# Patient Record
Sex: Male | Born: 1990 | Race: White | Hispanic: No | Marital: Single | Smoking: Never smoker
Health system: Southern US, Community
[De-identification: ages and names within clinical notes are randomized; demographics above are authoritative.]

## PROBLEM LIST (undated history)

## (undated) ENCOUNTER — Emergency Department (HOSPITAL_COMMUNITY): Discharge status: Absent without leave | Payer: Self-pay

## (undated) HISTORY — PX: TONSILLECTOMY: SUR1361

## (undated) HISTORY — PX: TYMPANOSTOMY TUBE PLACEMENT: SHX32

---
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
## 2016-05-24 ENCOUNTER — Emergency Department (HOSPITAL_COMMUNITY)
Admission: EM | Admit: 2016-05-24 | Discharge: 2016-05-24 | Disposition: A | Discharge status: Home / Self Care | Payer: BLUE CROSS/BLUE SHIELD | Attending: Emergency Medicine | Admitting: Emergency Medicine

## 2016-05-24 ENCOUNTER — Emergency Department (HOSPITAL_COMMUNITY): Payer: BLUE CROSS/BLUE SHIELD

## 2016-05-24 ENCOUNTER — Encounter (HOSPITAL_COMMUNITY): Payer: Self-pay

## 2016-05-24 DIAGNOSIS — R10813 Right lower quadrant abdominal tenderness: Secondary | ICD-10-CM | POA: Insufficient documentation

## 2016-05-24 DIAGNOSIS — B349 Viral infection, unspecified: Secondary | ICD-10-CM | POA: Insufficient documentation

## 2016-05-24 DIAGNOSIS — R509 Fever, unspecified: Secondary | ICD-10-CM | POA: Diagnosis present

## 2016-05-24 LAB — URINE MICROSCOPIC-ADD ON

## 2016-05-24 LAB — URINALYSIS, ROUTINE W REFLEX MICROSCOPIC
Bilirubin Urine: NEGATIVE
GLUCOSE, UA: NEGATIVE mg/dL
Ketones, ur: 40 mg/dL — AB
Leukocytes, UA: NEGATIVE
Nitrite: NEGATIVE
Protein, ur: NEGATIVE mg/dL
SPECIFIC GRAVITY, URINE: 1.022 (ref 1.005–1.030)
pH: 6 (ref 5.0–8.0)

## 2016-05-24 LAB — CBC
HEMATOCRIT: 43.4 % (ref 39.0–52.0)
HEMOGLOBIN: 15 g/dL (ref 13.0–17.0)
MCH: 30.7 pg (ref 26.0–34.0)
MCHC: 34.6 g/dL (ref 30.0–36.0)
MCV: 88.9 fL (ref 78.0–100.0)
Platelets: 278 10*3/uL (ref 150–400)
RBC: 4.88 MIL/uL (ref 4.22–5.81)
RDW: 12.4 % (ref 11.5–15.5)
WBC: 14.6 10*3/uL — ABNORMAL HIGH (ref 4.0–10.5)

## 2016-05-24 LAB — COMPREHENSIVE METABOLIC PANEL
ALT: 21 U/L (ref 17–63)
ANION GAP: 9 (ref 5–15)
AST: 19 U/L (ref 15–41)
Albumin: 4.8 g/dL (ref 3.5–5.0)
Alkaline Phosphatase: 50 U/L (ref 38–126)
BUN: 12 mg/dL (ref 6–20)
CHLORIDE: 102 mmol/L (ref 101–111)
CO2: 24 mmol/L (ref 22–32)
Calcium: 9.4 mg/dL (ref 8.9–10.3)
Creatinine, Ser: 0.96 mg/dL (ref 0.61–1.24)
GFR calc non Af Amer: >60 mL/min (ref >60)
Glucose, Bld: 112 mg/dL — ABNORMAL HIGH (ref 65–99)
Potassium: 3.7 mmol/L (ref 3.5–5.1)
SODIUM: 135 mmol/L (ref 135–145)
Total Bilirubin: 1 mg/dL (ref 0.3–1.2)
Total Protein: 8.1 g/dL (ref 6.5–8.1)

## 2016-05-24 LAB — LIPASE, BLOOD: LIPASE: 22 U/L (ref 11–51)

## 2016-05-24 MED ORDER — IOPAMIDOL (ISOVUE-300) INJECTION 61%
100.0000 mL | Freq: Once | INTRAVENOUS | Status: AC | PRN
  Administered 2016-05-24: 100 mL via INTRAVENOUS

## 2016-05-24 MED ORDER — METOCLOPRAMIDE HCL 5 MG/ML IJ SOLN
5.0000 mg | Freq: Once | INTRAMUSCULAR | Status: AC
  Administered 2016-05-24: 5 mg via INTRAVENOUS
  Filled 2016-05-24: qty 2

## 2016-05-24 MED ORDER — MORPHINE SULFATE (PF) 2 MG/ML IV SOLN
INTRAVENOUS | Status: AC
  Filled 2016-05-24: qty 1

## 2016-05-24 MED ORDER — ACETAMINOPHEN 325 MG PO TABS
650.0000 mg | ORAL_TABLET | Freq: Once | ORAL | Status: AC
  Administered 2016-05-24: 650 mg via ORAL
  Filled 2016-05-24: qty 2

## 2016-05-24 MED ORDER — DIPHENHYDRAMINE HCL 50 MG/ML IJ SOLN
12.5000 mg | Freq: Once | INTRAMUSCULAR | Status: AC
Start: 2016-05-24 — End: 2016-05-24
  Administered 2016-05-24: 12.5 mg via INTRAVENOUS
  Filled 2016-05-24: qty 1

## 2016-05-24 MED ORDER — ONDANSETRON 8 MG PO TBDP: 8.0000 mg | ORAL_TABLET | Freq: Three times a day (TID) | ORAL | 0 refills | Status: DC | PRN

## 2016-05-24 MED ORDER — IOPAMIDOL (ISOVUE-300) INJECTION 61%
15.0000 mL | Freq: Two times a day (BID) | INTRAVENOUS | Status: DC | PRN
  Administered 2016-05-24: 15 mL via ORAL
  Filled 2016-05-24: qty 30

## 2016-05-24 MED ORDER — HYDROCODONE-ACETAMINOPHEN 5-325 MG PO TABS: 2.0000 | ORAL_TABLET | ORAL | 0 refills | Status: DC | PRN

## 2016-05-24 MED ORDER — ONDANSETRON HCL 4 MG/2ML IJ SOLN
4.0000 mg | Freq: Once | INTRAMUSCULAR | Status: AC
  Administered 2016-05-24: 4 mg via INTRAVENOUS
  Filled 2016-05-24: qty 2

## 2016-05-24 MED ORDER — SODIUM CHLORIDE 0.9 % IV BOLUS (SEPSIS)
1000.0000 mL | Freq: Once | INTRAVENOUS | Status: AC
  Administered 2016-05-24: 1000 mL via INTRAVENOUS

## 2016-05-24 MED ORDER — IOPAMIDOL (ISOVUE-300) INJECTION 61%
INTRAVENOUS | Status: AC
  Administered 2016-05-24: 15 mL
  Filled 2016-05-24: qty 30

## 2016-05-24 MED ORDER — MORPHINE SULFATE (PF) 2 MG/ML IV SOLN
2.0000 mg | Freq: Once | INTRAVENOUS | Status: AC
  Administered 2016-05-24: 2 mg via INTRAVENOUS
  Filled 2016-05-24: qty 1

## 2016-05-24 MED ORDER — KETOROLAC TROMETHAMINE 30 MG/ML IJ SOLN
30.0000 mg | Freq: Once | INTRAMUSCULAR | Status: AC
  Administered 2016-05-24: 30 mg via INTRAVENOUS
  Filled 2016-05-24: qty 1

## 2016-05-24 MED ORDER — METHOCARBAMOL 500 MG PO TABS: 500.0000 mg | ORAL_TABLET | Freq: Two times a day (BID) | ORAL | 0 refills | Status: DC

## 2016-05-24 MED ORDER — IOPAMIDOL (ISOVUE-300) INJECTION 61%
INTRAVENOUS | Status: AC
  Filled 2016-05-24: qty 100

## 2016-05-24 MED ORDER — SODIUM CHLORIDE 0.9 % IV SOLN: INTRAVENOUS | Status: DC

## 2016-05-24 NOTE — ED Notes (Signed)
Pt refuses IV or blood draw at this time. Explained risks/benefits. Pt still refuses.

## 2016-05-24 NOTE — ED Provider Notes (Signed)
[facility]-EMERGENCY DEPT Provider Note   CSN: [id number] Arrival date & time: 05/24/16  [id number]     History   Chief Complaint Chief Complaint  Patient presents with  . Fever  . Back Pain    HPI Christian Lucas is a 25 y.o. male.  25 year old male presents with 3 days of bilateral flank pain as well as myalgias and fever. Has had no cough or congestion. Has had anorexia. Has been using over-the-counter medications without relief. Denies any sore throat or cough congestion. No dysuria or hematuria. No possible urine. Went to an outside facility was found to have a temperature of 102.9. His heart rate is in the 140s. Patient examined there and concern for pyelonephritis versus appendicitis due to his lower abdominal discomfort on exam was obtained and patient sent here for further evaluation. Patient had a negative flu and strep at the outside facility. Patient states Christian Lucas is extremely ages at this time because Christian Lucas hates needles.      History reviewed. No pertinent past medical history.  There are no active problems to display for this patient.   Past Surgical History:  Procedure Laterality Date  . TONSILLECTOMY    . TYMPANOSTOMY TUBE PLACEMENT         Home Medications    Prior to Admission medications   Not on File    Family History History reviewed. No pertinent family history.  Social History Social History  Substance Use Topics  . Smoking status: Never Smoker  . Smokeless tobacco: Never Used  . Alcohol use Yes     Comment: social     Allergies   Patient has no known allergies.   Review of Systems Review of Systems  All other systems reviewed and are negative.    Physical Exam Updated Vital Signs BP 138/85 (BP Location: Left Arm)   Pulse (!) 146   Temp 99.6 F (37.6 C) (Oral) Comment: pt had water on the way  Resp 18   SpO2 98%   Physical Exam  Constitutional: Christian Lucas is oriented to person, place, and time. Christian Lucas appears well-developed and  well-nourished.  Non-toxic appearance. No distress.  HENT:  Head: Normocephalic and atraumatic.  Eyes: Conjunctivae, EOM and lids are normal. Pupils are equal, round, and reactive to light.  Neck: Normal range of motion. Neck supple. No tracheal deviation present. No thyroid mass present.  Cardiovascular: Regular rhythm and normal heart sounds.  Tachycardia present.  Exam reveals no gallop.   No murmur heard. Pulmonary/Chest: Effort normal and breath sounds normal. No stridor. No respiratory distress. Christian Lucas has no decreased breath sounds. Christian Lucas has no wheezes. Christian Lucas has no rhonchi. Christian Lucas has no rales.  Abdominal: Soft. Normal appearance and bowel sounds are normal. Christian Lucas exhibits no distension. There is tenderness in the right lower quadrant. There is no rebound and no CVA tenderness.    Musculoskeletal: Normal range of motion. Christian Lucas exhibits no edema or tenderness.  Neurological: Christian Lucas is alert and oriented to person, place, and time. Christian Lucas has normal strength. No cranial nerve deficit or sensory deficit. GCS eye subscore is 4. GCS verbal subscore is 5. GCS motor subscore is 6.  Skin: Skin is warm and dry. No abrasion and no rash noted.  Psychiatric: His speech is normal and behavior is normal. His mood appears anxious.  Nursing note and vitals reviewed.    ED Treatments / Results  Labs (all labs ordered are listed, but only abnormal results are displayed) Labs Reviewed  LIPASE, BLOOD  COMPREHENSIVE METABOLIC  PANEL  CBC  URINALYSIS, ROUTINE W REFLEX MICROSCOPIC (NOT AT [facility])    EKG  EKG Interpretation None       Radiology No results found.  Procedures Procedures (including critical care time)  Medications Ordered in ED Medications  0.9 %  sodium chloride infusion (not administered)  sodium chloride 0.9 % bolus 1,000 mL (not administered)  ondansetron (ZOFRAN) injection 4 mg (not administered)  morphine 2 MG/ML injection 2 mg (not administered)     Initial Impression / Assessment and Plan  / ED Course  I have reviewed the triage vital signs and the nursing notes.  Pertinent labs & imaging results that were available during my care of the patient were reviewed by me and considered in my medical decision making (see chart for details).  Clinical Course     Patient given IV hydration here for his dehydration. Abdominal CT without acute findings. Christian Lucas has no infectious signs of his urine. Suspect Christian Lucas has a viral etiology for his current symptoms. Return precautions given  Final Clinical Impressions(s) / ED Diagnoses   Final diagnoses:  None    New Prescriptions New Prescriptions   No medications on file     Lorre NickAnthony Marcelle Bebout, MD 05/24/16 1306

## 2016-05-24 NOTE — ED Notes (Signed)
ED Provider at bedside. 

## 2016-05-24 NOTE — ED Notes (Signed)
Patient given urnal and will call out when sample is ready

## 2016-05-24 NOTE — ED Notes (Signed)
Pt given discharge instructions and expressed understanding.  No reaction to medication noted at time of discharge.  Pt escorted by parents out of department.

## 2016-05-24 NOTE — ED Notes (Signed)
[facility] facility called and reports Pt had fever 102.  No medications were given.  Also, strep and flu swabs were negative.

## 2016-05-24 NOTE — ED Notes (Signed)
Urinal bedside  

## 2016-05-24 NOTE — ED Triage Notes (Signed)
Pt hasn't felt well for 3 days. Fever x 3 days.  Bilateral lower back pain.  Body aches with nausea.  Poor appetite.  Pt went today to [facility].  Pt told to come here.  Strep and flu test were done.  However pt also had fever of 102.9.  Not medicated at facility.  HR elevated in assessment 140's

## 2016-05-24 NOTE — ED Notes (Signed)
Pt transported to CT ?

## 2017-01-12 ENCOUNTER — Ambulatory Visit: Payer: BLUE CROSS/BLUE SHIELD | Admitting: Family Medicine

## 2017-01-19 ENCOUNTER — Ambulatory Visit (INDEPENDENT_AMBULATORY_CARE_PROVIDER_SITE_OTHER): Payer: BLUE CROSS/BLUE SHIELD | Admitting: Family Medicine

## 2017-01-19 ENCOUNTER — Encounter: Payer: Self-pay | Admitting: Family Medicine

## 2017-01-19 VITALS — BP 122/68 | HR 77 | Temp 98.0°F | Ht 65.0 in | Wt 180.6 lb

## 2017-01-19 DIAGNOSIS — Z0000 Encounter for general adult medical examination without abnormal findings: Secondary | ICD-10-CM

## 2017-01-19 LAB — BASIC METABOLIC PANEL
BUN: 13 mg/dL (ref 6–23)
CO2: 32 mEq/L (ref 19–32)
CREATININE: 0.86 mg/dL (ref 0.40–1.50)
Calcium: 10 mg/dL (ref 8.4–10.5)
Chloride: 101 mEq/L (ref 96–112)
GFR: 116.26 mL/min (ref >60.00)
GLUCOSE: 107 mg/dL — AB (ref 70–99)
Potassium: 3.9 mEq/L (ref 3.5–5.1)
Sodium: 139 mEq/L (ref 135–145)

## 2017-01-19 LAB — CBC WITH DIFFERENTIAL/PLATELET
BASOS PCT: 0.4 % (ref 0.0–3.0)
Basophils Absolute: 0 10*3/uL (ref 0.0–0.1)
EOS ABS: 0.1 10*3/uL (ref 0.0–0.7)
EOS PCT: 1.3 % (ref 0.0–5.0)
HEMATOCRIT: 46.8 % (ref 39.0–52.0)
HEMOGLOBIN: 15.9 g/dL (ref 13.0–17.0)
LYMPHS PCT: 37.9 % (ref 12.0–46.0)
Lymphs Abs: 2.2 10*3/uL (ref 0.7–4.0)
MCHC: 34 g/dL (ref 30.0–36.0)
MCV: 88.8 fl (ref 78.0–100.0)
Monocytes Absolute: 0.6 10*3/uL (ref 0.1–1.0)
Monocytes Relative: 10.1 % (ref 3.0–12.0)
Neutro Abs: 2.9 10*3/uL (ref 1.4–7.7)
Neutrophils Relative %: 50.3 % (ref 43.0–77.0)
Platelets: 315 10*3/uL (ref 150.0–400.0)
RBC: 5.27 Mil/uL (ref 4.22–5.81)
RDW: 12.4 % (ref 11.5–15.5)
WBC: 5.7 10*3/uL (ref 4.0–10.5)

## 2017-01-19 LAB — HEPATIC FUNCTION PANEL
ALK PHOS: 43 U/L (ref 39–117)
ALT: 21 U/L (ref 0–53)
AST: 16 U/L (ref 0–37)
Albumin: 4.6 g/dL (ref 3.5–5.2)
BILIRUBIN TOTAL: 0.7 mg/dL (ref 0.2–1.2)
Bilirubin, Direct: 0.1 mg/dL (ref 0.0–0.3)
Total Protein: 7.2 g/dL (ref 6.0–8.3)

## 2017-01-19 LAB — LIPID PANEL
CHOLESTEROL: 201 mg/dL — AB (ref 0–200)
HDL: 43.5 mg/dL (ref >39.00)
LDL Cholesterol: 130 mg/dL — ABNORMAL HIGH (ref 0–99)
NONHDL: 157.33
Total CHOL/HDL Ratio: 5
Triglycerides: 137 mg/dL (ref 0.0–149.0)
VLDL: 27.4 mg/dL (ref 0.0–40.0)

## 2017-01-19 LAB — TSH: TSH: 1.16 u[IU]/mL (ref 0.35–4.50)

## 2017-01-19 MED ORDER — SUMATRIPTAN SUCCINATE 100 MG PO TABS
100.0000 mg | ORAL_TABLET | ORAL | 5 refills | Status: DC | PRN
Start: 2017-01-19 — End: 2017-08-03

## 2017-01-19 MED ORDER — MELOXICAM 15 MG PO TABS: 15.0000 mg | ORAL_TABLET | Freq: Every day | ORAL | 3 refills | Status: DC

## 2017-01-19 NOTE — Patient Instructions (Addendum)
WE NOW OFFER   [facility] FAST TRACK!!!  SAME DAY Appointments for ACUTE CARE  Such as: Sprains, Injuries, cuts, abrasions, rashes, muscle pain, joint pain, back pain Colds, flu, sore throats, headache, allergies, cough, fever  Ear pain, sinus and eye infections Abdominal pain, nausea, vomiting, diarrhea, upset stomach Animal/insect bites  3 Easy Ways to Schedule: Walk-In Scheduling Call in scheduling Mychart Sign-up: [email or web address]         Migraine Headache A migraine headache is an intense, throbbing pain on one side or both sides of the head. Migraines may also cause other symptoms, such as nausea, vomiting, and sensitivity to light and noise. What are the causes? Doing or taking certain things may also trigger migraines, such as:  Alcohol.  Smoking.  Medicines, such as:  Medicine used to treat chest pain (nitroglycerine).  Birth control pills.  Estrogen pills.  Certain blood pressure medicines.  Aged cheeses, chocolate, or caffeine.  Foods or drinks that contain nitrates, glutamate, aspartame, or tyramine.  Physical activity. Other things that may trigger a migraine include:  Menstruation.  Pregnancy.  Hunger.  Stress, lack of sleep, too much sleep, or fatigue.  Weather changes. What increases the risk? The following factors may make you more likely to experience migraine headaches:  Age. Risk increases with age.  Family history of migraine headaches.  Being Caucasian.  Depression and anxiety.  Obesity.  Being a woman.  Having a hole in the heart (patent foramen ovale) or other heart problems. What are the signs or symptoms? The main symptom of this condition is pulsating or throbbing pain. Pain may:  Happen in any area of the head, such as on one side or both sides.  Interfere with daily activities.  Get worse with physical activity.  Get worse with exposure to bright lights or loud noises. Other  symptoms may include:  Nausea.  Vomiting.  Dizziness.  General sensitivity to bright lights, loud noises, or smells. Before you get a migraine, you may get warning signs that a migraine is developing (aura). An aura may include:  Seeing flashing lights or having blind spots.  Seeing bright spots, halos, or zigzag lines.  Having tunnel vision or blurred vision.  Having numbness or a tingling feeling.  Having trouble talking.  Having muscle weakness. How is this diagnosed? A migraine headache can be diagnosed based on:  Your symptoms.  A physical exam.  Tests, such as CT scan or MRI of the head. These imaging tests can help rule out other causes of headaches.  Taking fluid from the spine (lumbar puncture) and analyzing it (cerebrospinal fluid analysis, or CSF analysis). How is this treated? A migraine headache is usually treated with medicines that:  Relieve pain.  Relieve nausea.  Prevent migraines from coming back. Treatment may also include:  Acupuncture.  Lifestyle changes like avoiding foods that trigger migraines. Follow these instructions at home: Medicines   Take over-the-counter and prescription medicines only as told by your health care provider.  Do not drive or use heavy machinery while taking prescription pain medicine.  To prevent or treat constipation while you are taking prescription pain medicine, your health care provider may recommend that you:  Drink enough fluid to keep your urine clear or pale yellow.  Take over-the-counter or prescription medicines.  Eat foods that are high in fiber, such as fresh fruits and vegetables, whole grains, and beans.  Limit foods that are high in fat and processed sugars, such as fried and sweet foods. Lifestyle     Avoid alcohol use.  Do not use any products that contain nicotine or tobacco, such as cigarettes and e-cigarettes. If you need help quitting, ask your health care provider.  Get at least 8  hours of sleep every night.  Limit your stress. General instructions    Keep a journal to find out what may trigger your migraine headaches. For example, write down:  What you eat and drink.  How much sleep you get.  Any change to your diet or medicines.  If you have a migraine:  Avoid things that make your symptoms worse, such as bright lights.  It may help to lie down in a dark, quiet room.  Do not drive or use heavy machinery.  Ask your health care provider what activities are safe for you while you are experiencing symptoms.  Keep all follow-up visits as told by your health care provider. This is important. Contact a health care provider if:  You develop symptoms that are different or more severe than your usual migraine symptoms. Get help right away if:  Your migraine becomes severe.  You have a fever.  You have a stiff neck.  You have vision loss.  Your muscles feel weak or like you cannot control them.  You start to lose your balance often.  You develop trouble walking.  You faint. This information is not intended to replace advice given to you by your health care provider. Make sure you discuss any questions you have with your health care provider. Document Released: 06/21/2005 Document Revised: 01/09/2016 Document Reviewed: 12/08/2015 Elsevier Interactive Patient Education  2017 Elsevier Inc.  

## 2017-01-19 NOTE — Progress Notes (Signed)
Subjective:     Patient ID: Christian Lucas, male   DOB: 01/20/1991, 26 y.o.   MRN: [id number]  HPI Patient seen to establish care and for well visit. I had seen him over 10 years ago previous practice. 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 He takes no regular medications. He apparently gained weight at over 200 pounds and had some borderline elevated blood pressures and has made some major lifestyle changes in the past year. He is exercising with running 3 days per week and making better dietary sources and has lost over 20 pounds and blood pressures have been normal.  History of migraine headaches. He states he sometimes has up to 3 or 4 per week. Uses over-the-counter Motrin without much success. He only gets about 5 hours sleep per night. Not aware of any food triggers. Possibly related to stress. No caffeine use.  Chronic back difficulties cervical to lumbar. He states he has been diagnosed in the past with some mild scoliosis. Has some almost daily pain  And recent tetanus booster. Other immunizations up-to-date.  Family history significant for father with coronary disease in his 7340s. Patient would like some baseline labs especially with lipids. He is nonsmoker. No regular alcohol use.  No past medical history on file. Past Surgical History:  Procedure Laterality Date  . TONSILLECTOMY    . TYMPANOSTOMY TUBE PLACEMENT      reports that he has never smoked. He has never used smokeless tobacco. He reports that he drinks alcohol. He reports that he does not use drugs. family history is not on file. No Known Allergies   Review of Systems  Constitutional: Negative for activity change, appetite change, fatigue and fever.  HENT: Negative for congestion, ear pain and trouble swallowing.   Eyes: Negative for pain and visual disturbance.  Respiratory: Negative for cough, shortness of breath and wheezing.   Cardiovascular: Negative for chest pain and  palpitations.  Gastrointestinal: Negative for abdominal distention, abdominal pain, blood in stool, constipation, diarrhea, nausea, rectal pain and vomiting.  Endocrine: Negative for polydipsia and polyuria.  Genitourinary: Negative for dysuria, hematuria and testicular pain.  Musculoskeletal: Negative for arthralgias and joint swelling.  Skin: Negative for rash.  Neurological: Positive for headaches. Negative for dizziness and syncope.  Hematological: Negative for adenopathy.  Psychiatric/Behavioral: Negative for confusion and dysphoric mood.       Objective:   Physical Exam  Constitutional: He is oriented to person, place, and time. He appears well-developed and well-nourished. No distress.  HENT:  Head: Normocephalic and atraumatic.  Right Ear: External ear normal.  Left Ear: External ear normal.  Mouth/Throat: Oropharynx is clear and moist.  Eyes: Pupils are equal, round, and reactive to light. Conjunctivae and EOM are normal.  Neck: Normal range of motion. Neck supple. No thyromegaly present.  Cardiovascular: Normal rate, regular rhythm and normal heart sounds.   No murmur heard. Pulmonary/Chest: No respiratory distress. He has no wheezes. He has no rales.  Abdominal: Soft. Bowel sounds are normal. He exhibits no distension and no mass. There is no tenderness. There is no rebound and no guarding.  Musculoskeletal: He exhibits no edema.  Lymphadenopathy:    He has no cervical adenopathy.  Neurological: He is alert and oriented to person, place, and time. He displays normal reflexes. No cranial nerve deficit.  Skin: No rash noted.  Psychiatric: He has a normal mood and affect.       Assessment:     Physical exam. Patient  has family history of premature CAD as above. Frequent migraine headaches. History of borderline elevated blood pressure in the past but well controlled today.  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 -Did prescribe meloxicam 15 mg to take once daily as needed but try to avoid regular use  Kristian Covey MD [facility]

## 2017-01-21 ENCOUNTER — Ambulatory Visit (INDEPENDENT_AMBULATORY_CARE_PROVIDER_SITE_OTHER): Payer: BLUE CROSS/BLUE SHIELD | Admitting: *Deleted

## 2017-01-21 DIAGNOSIS — Z111 Encounter for screening for respiratory tuberculosis: Secondary | ICD-10-CM

## 2017-01-21 NOTE — Progress Notes (Signed)
Patient here for PPD skin test for college; administered ID to left forearm; patient tolerated well; patient to return in 48-72 hours for reading; understanding voiced.

## 2017-01-24 LAB — TB SKIN TEST
Induration: 0 mm
TB SKIN TEST: NEGATIVE

## 2017-01-24 NOTE — Progress Notes (Addendum)
PPD READ DATE 01/24/17 @ 9:44am  PPD Reading Note PPD read and results entered in EpicCare. Result: 0 mm induration. Interpretation: NEGATIVE If test not read within 48-72 hours of initial placement, patient advised to repeat in other arm 1-3 weeks after this test. Allergic reaction: no

## 2017-02-02 ENCOUNTER — Telehealth: Payer: Self-pay | Admitting: Family Medicine

## 2017-02-02 MED ORDER — HEPATITIS A VACCINE 25 UNIT/0.5ML IM SUSP: INTRAMUSCULAR | 0 refills | Status: DC

## 2017-02-02 NOTE — Telephone Encounter (Signed)
Okay to send Rx 

## 2017-02-02 NOTE — Telephone Encounter (Signed)
OK 

## 2017-02-02 NOTE — Telephone Encounter (Signed)
Rx sent 

## 2017-02-02 NOTE — Telephone Encounter (Signed)
Pt needs a rx for hep A fax to [facility] [phone]. Pt was last seen on 01/19/17

## 2017-02-07 ENCOUNTER — Telehealth: Payer: Self-pay | Admitting: Family Medicine

## 2017-02-07 MED ORDER — HEPATITIS A VACCINE 50 UNIT/ML IM SUSP: 50.0000 [IU] | Freq: Once | INTRAMUSCULAR | 0 refills | Status: AC

## 2017-02-07 NOTE — Telephone Encounter (Signed)
New rx sent

## 2017-02-07 NOTE — Telephone Encounter (Signed)
° ° ° °  Pharmacy call to say the 25ml is a pediatric dose and is asking if pt should be getting the 50 units per 1ml   [phone] Alisha

## 2017-05-31 IMAGING — CT CT ABD-PELV W/ CM
2 of 4 series · 16 of 46 positions shown, 18 images · IV contrast (ISOVUE)
Comparison: None.

CLINICAL DATA: Fever for 3 days.

EXAM:
CT ABDOMEN AND PELVIS WITH CONTRAST
TECHNIQUE: Multidetector CT imaging of the abdomen and pelvis was performed
using the standard protocol following bolus administration of
intravenous contrast.
CONTRAST:  100mL 6EQSP0-ISS IOPAMIDOL (6EQSP0-ISS) INJECTION 61%

[Series 2: abd/pel with · axial · 0.74mm/px · z∈[+1114,+1544]mm · 13 of 96 slices shown, 15 images]
[im 5/96  soft-tissue]
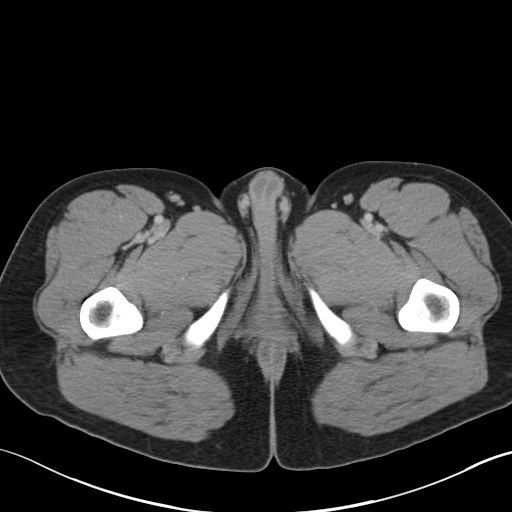
[im 5/96  bone]
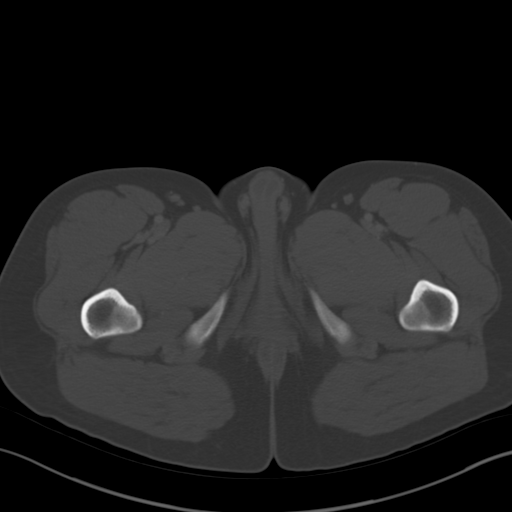
[im 14/96  soft-tissue]
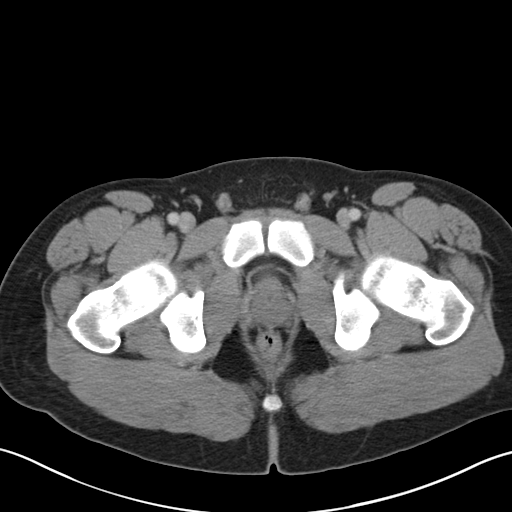
[im 19/96  soft-tissue]
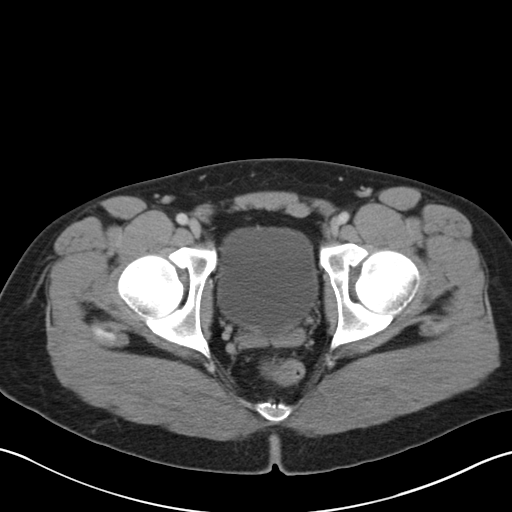
[im 28/96  soft-tissue]
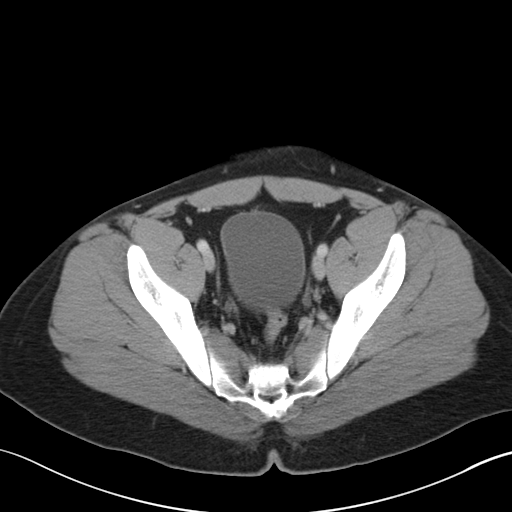
[im 32/96  soft-tissue]
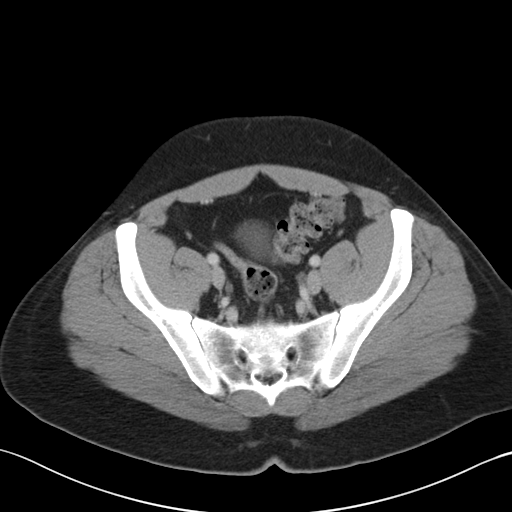
[im 41/96  soft-tissue]
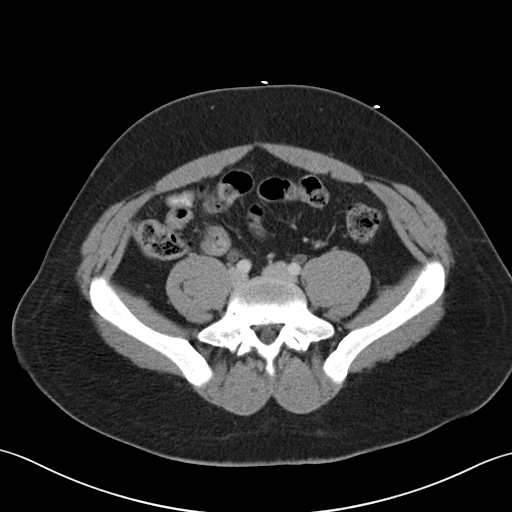
[im 50/96  soft-tissue]
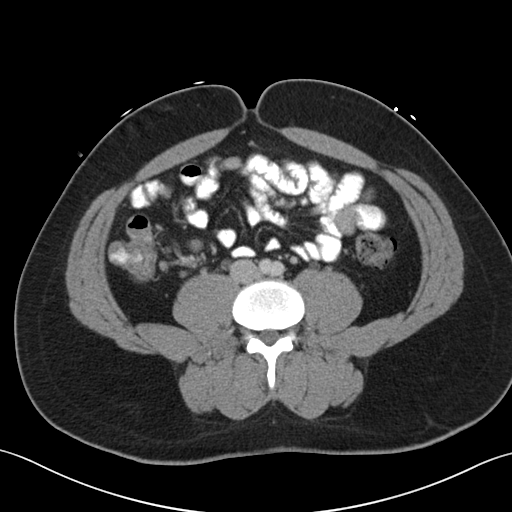
[im 55/96  soft-tissue]
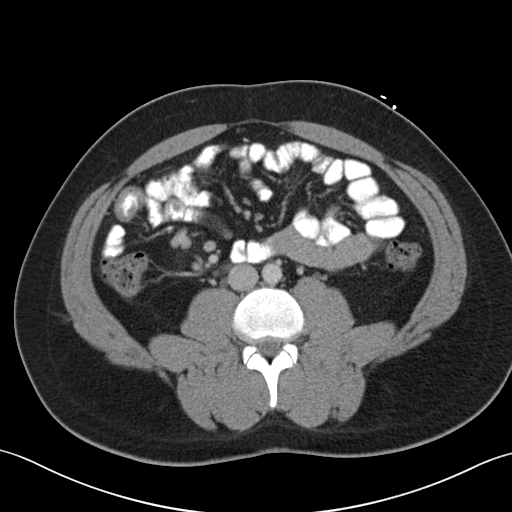
[im 64/96  soft-tissue]
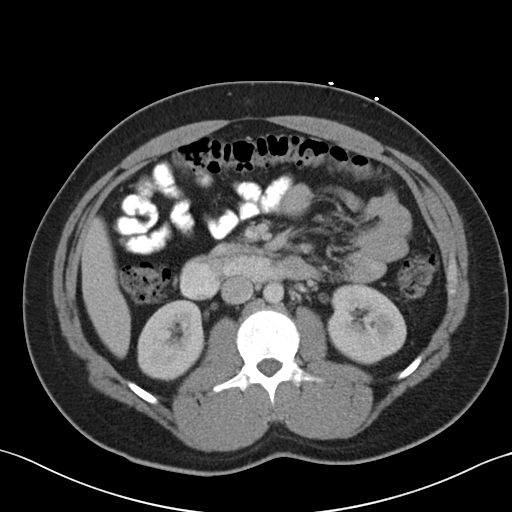
[im 64/96  bone]
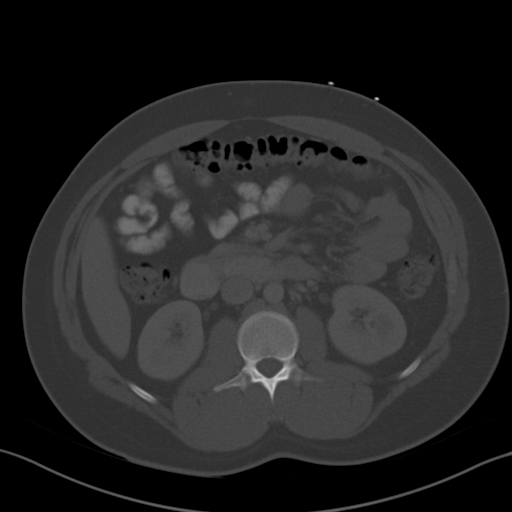
[im 68/96  soft-tissue]
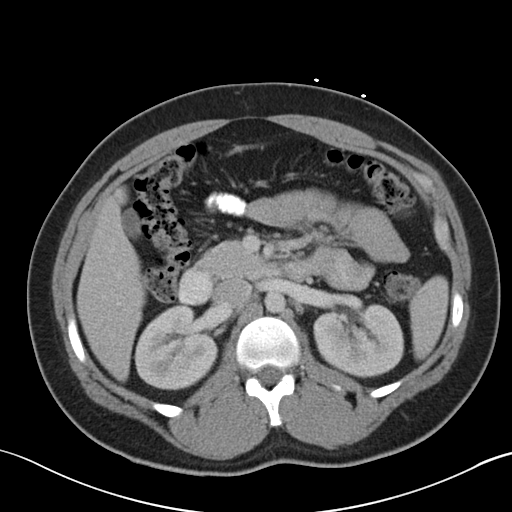
[im 77/96  soft-tissue]
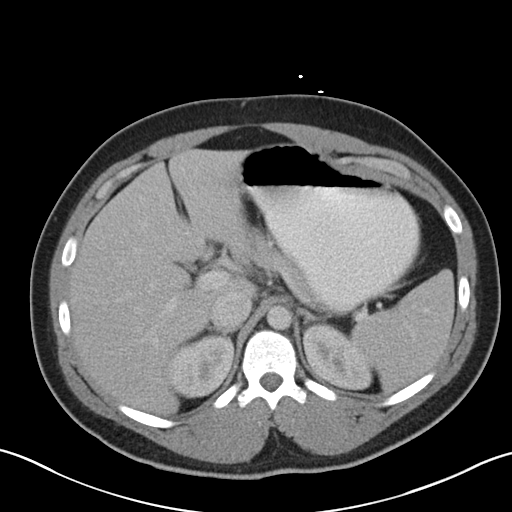
[im 82/96  soft-tissue]
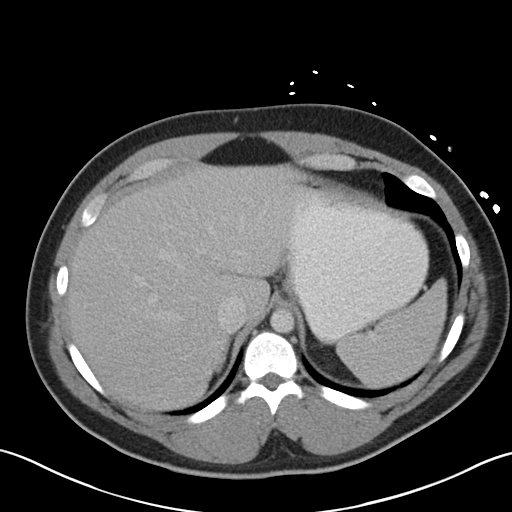
[im 91/96  soft-tissue]
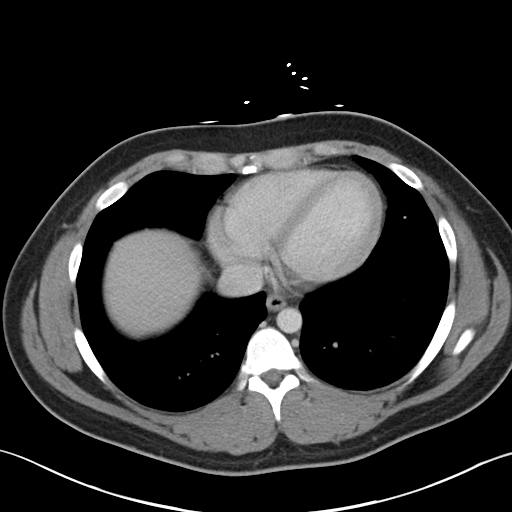

[Series 5: coronal a/|p · coronal · 0.74mm/px · 3 of 156 slices shown]
[im 52/156  soft-tissue]
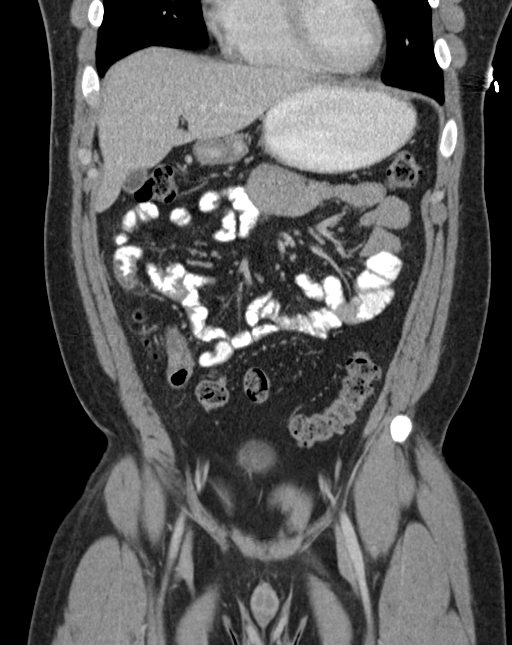
[im 69/156  soft-tissue]
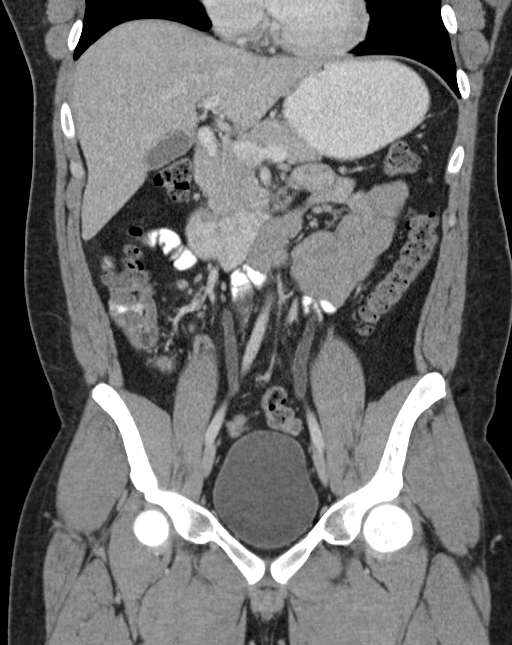
[im 87/156  soft-tissue]
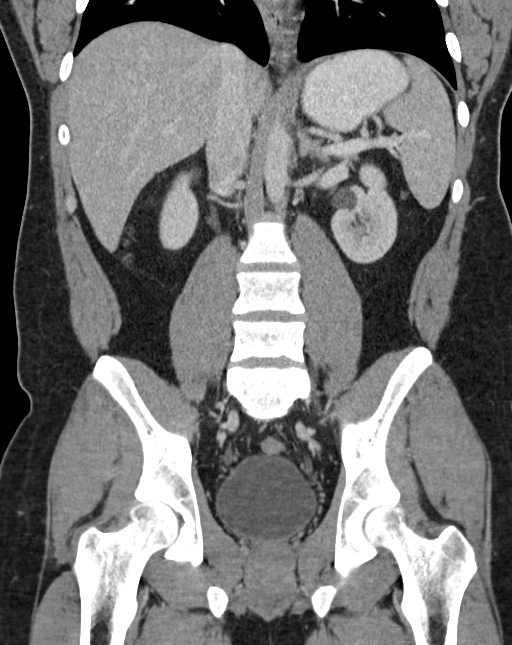

[16 of 46 positions shown; findings below may reference images not displayed]

FINDINGS: Lower chest: No acute abnormality.

Hepatobiliary: No focal liver abnormality is seen. No gallstones,
gallbladder wall thickening, or biliary dilatation.

Pancreas: Unremarkable. No pancreatic ductal dilatation or
surrounding inflammatory changes.

Spleen: Normal in size without focal abnormality.

Adrenals/Urinary Tract: Adrenal glands are unremarkable. Kidneys are
normal, without renal calculi, focal lesion, or hydronephrosis.
Bladder is unremarkable.

Stomach/Bowel: Stomach is within normal limits. Appendix appears
normal. No evidence of bowel wall thickening, distention, or
inflammatory changes.

Vascular/Lymphatic: Normal appearance of the abdominal aorta.
Prominent right lower quadrant ileocolic mesenteric lymph nodes
identified measuring up to 11 mm, image 46 of series 2.

Reproductive: Prostate is unremarkable.

Other: No abdominal wall hernia or abnormality. No abdominopelvic
ascites.

Musculoskeletal: No acute or significant osseous findings.
IMPRESSION: 1. No explanation for lower back pain and fevers. The appendix is
visualized and appears normal.
2. Prominent right lower quadrant mesenteric ileocolic lymph nodes
are identified.

## 2017-08-03 ENCOUNTER — Other Ambulatory Visit: Payer: Self-pay | Admitting: Family Medicine

## 2018-01-24 ENCOUNTER — Encounter: Payer: Self-pay | Admitting: Family Medicine

## 2018-01-24 ENCOUNTER — Ambulatory Visit (INDEPENDENT_AMBULATORY_CARE_PROVIDER_SITE_OTHER): Payer: BLUE CROSS/BLUE SHIELD | Admitting: Family Medicine

## 2018-01-24 VITALS — BP 110/80 | HR 128 | Temp 98.7°F | Ht 66.0 in | Wt 210.3 lb

## 2018-01-24 DIAGNOSIS — E785 Hyperlipidemia, unspecified: Secondary | ICD-10-CM

## 2018-01-24 DIAGNOSIS — G43909 Migraine, unspecified, not intractable, without status migrainosus: Secondary | ICD-10-CM | POA: Diagnosis not present

## 2018-01-24 DIAGNOSIS — R739 Hyperglycemia, unspecified: Secondary | ICD-10-CM | POA: Diagnosis not present

## 2018-01-24 DIAGNOSIS — Z0000 Encounter for general adult medical examination without abnormal findings: Secondary | ICD-10-CM

## 2018-01-24 DIAGNOSIS — E669 Obesity, unspecified: Secondary | ICD-10-CM | POA: Insufficient documentation

## 2018-01-24 MED ORDER — TOPIRAMATE 25 MG PO TABS: ORAL_TABLET | ORAL | 5 refills | Status: DC

## 2018-01-24 NOTE — Progress Notes (Signed)
Subjective:     Patient ID: Christian Lucas, male   DOB: 12/14/1990, 27 y.o.   MRN: [id number]  HPI Patient here for physical exam-and for additional evaluation of chronic problem as below with migraine headaches. He is currently attending graduate school [employer]. 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 Extremely busy and very stressful with regard to school. He states he is a "stress eater ". He's gained about 30 pounds since last year. Not exercising regularly. Glucose last year 107 fasting. No follow-up since then. Also had mildly elevated lipids.  He has history of migraine headaches. He feels combination of poor sleep quality and increased stress have triggered frequent headaches. Sometimes up to 3 per week. He usually runs out of Imitrex because of frequency. Has not been on prophylactic medications previously. He is not aware of any food triggers.  No past medical history on file. Past Surgical History:  Procedure Laterality Date  . TONSILLECTOMY    . TYMPANOSTOMY TUBE PLACEMENT      reports that he has never smoked. He has never used smokeless tobacco. He reports that he drinks alcohol. He reports that he does not use drugs. family history includes Cancer (age of onset: 2270) in his maternal grandfather; Heart disease (age of onset: 3045) in his father; Hyperlipidemia in his father; Hypertension in his father. No Known Allergies   Review of Systems  Constitutional: Negative for activity change, appetite change, fatigue and fever.  HENT: Negative for congestion, ear pain and trouble swallowing.   Eyes: Negative for pain and visual disturbance.  Respiratory: Negative for cough, shortness of breath and wheezing.   Cardiovascular: Negative for chest pain and palpitations.  Gastrointestinal: Negative for abdominal distention, abdominal pain, blood in stool, constipation, diarrhea, nausea, rectal pain and vomiting.   Genitourinary: Negative for dysuria, hematuria and testicular pain.  Musculoskeletal: Negative for arthralgias and joint swelling.  Skin: Negative for rash.  Neurological: Positive for headaches. Negative for dizziness and syncope.  Hematological: Negative for adenopathy.  Psychiatric/Behavioral: Negative for confusion and dysphoric mood.       Objective:   Physical Exam  Constitutional: He is oriented to person, place, and time. He appears well-developed and well-nourished. No distress.  HENT:  Head: Normocephalic and atraumatic.  Right Ear: External ear normal.  Left Ear: External ear normal.  Mouth/Throat: Oropharynx is clear and moist.  Eyes: Pupils are equal, round, and reactive to light. Conjunctivae and EOM are normal.  Neck: Normal range of motion. Neck supple. No thyromegaly present.  Cardiovascular: Normal rate, regular rhythm and normal heart sounds.  No murmur heard. Pulmonary/Chest: No respiratory distress. He has no wheezes. He has no rales.  Abdominal: Soft. Bowel sounds are normal. He exhibits no distension and no mass. There is no tenderness. There is no rebound and no guarding.  Musculoskeletal: He exhibits no edema.  Lymphadenopathy:    He has no cervical adenopathy.  Neurological: He is alert and oriented to person, place, and time. He displays normal reflexes. No cranial nerve deficit.  Skin: No rash noted.  Psychiatric: He has a normal mood and affect.       Assessment:     #1 physical exam. Patient had substantial weight gain since last year which he attributes to stress eating and lack of activity. He has history of mildly elevated glucose last year from fasting labs and mild hyperlipidemia. Unfortunately, he is not fasting today and is heading back  to school later today  #2 frequent migraine headaches probably triggered by erratic sleep schedule and increased stress    Plan:     -We wrote for repeat lipids and fasting glucose along with A1c and he  will try to return back for those fasting within the next few months -Strongly encouraged to lose some weight and establish more consistent exercise. We discussed strategies for weight loss -Discussed prevention of migraines. Try to establish more consistent sleep patterns. Consider trial of Topamax 25 mgs daily at bedtime for one week and then titrate to 50 mg. He'll give me some feedback in a month and consider further titration of Topamax as needed  Kristian Covey MD [facility] at [facility]

## 2018-01-24 NOTE — Patient Instructions (Signed)
Exercising to Lose Weight Exercising can help you to lose weight. In order to lose weight through exercise, you need to do vigorous-intensity exercise. You can tell that you are exercising with vigorous intensity if you are breathing very hard and fast and cannot hold a conversation while exercising. Moderate-intensity exercise helps to maintain your current weight. You can tell that you are exercising at a moderate level if you have a higher heart rate and faster breathing, but you are still able to hold a conversation. How often should I exercise? Choose an activity that you enjoy and set realistic goals. Your health care provider can help you to make an activity plan that works for you. Exercise regularly as directed by your health care provider. This may include:  Doing resistance training twice each week, such as: ? Push-ups. ? Sit-ups. ? Lifting weights. ? Using resistance bands.  Doing a given intensity of exercise for a given amount of time. Choose from these options: ? 150 minutes of moderate-intensity exercise every week. ? 75 minutes of vigorous-intensity exercise every week. ? A mix of moderate-intensity and vigorous-intensity exercise every week.  Children, pregnant women, people who are out of shape, people who are overweight, and older adults may need to consult a health care provider for individual recommendations. If you have any sort of medical condition, be sure to consult your health care provider before starting a new exercise program. What are some activities that can help me to lose weight?  Walking at a rate of at least 4.5 miles an hour.  Jogging or running at a rate of 5 miles per hour.  Biking at a rate of at least 10 miles per hour.  Lap swimming.  Roller-skating or in-line skating.  Cross-country skiing.  Vigorous competitive sports, such as football, basketball, and soccer.  Jumping rope.  Aerobic dancing. How can I be more active in my day-to-day  activities?  Use the stairs instead of the elevator.  Take a walk during your lunch break.  If you drive, park your car farther away from work or school.  If you take public transportation, get off one stop early and walk the rest of the way.  Make all of your phone calls while standing up and walking around.  Get up, stretch, and walk around every 30 minutes throughout the day. What guidelines should I follow while exercising?  Do not exercise so much that you hurt yourself, feel dizzy, or get very short of breath.  Consult your health care provider prior to starting a new exercise program.  Wear comfortable clothes and shoes with good support.  Drink plenty of water while you exercise to prevent dehydration or heat stroke. Body water is lost during exercise and must be replaced.  Work out until you breathe faster and your heart beats faster. This information is not intended to replace advice given to you by your health care provider. Make sure you discuss any questions you have with your health care provider. Document Released: 07/24/2010 Document Revised: 11/27/2015 Document Reviewed: 11/22/2013 Elsevier Interactive Patient Education  2018 Elsevier Inc.  

## 2018-02-02 ENCOUNTER — Telehealth: Payer: Self-pay | Admitting: Family Medicine

## 2018-02-02 NOTE — Telephone Encounter (Signed)
Copied from CRM [phone]. Topic: General - Other >> Feb 02, 2018  8:29 AM Gaynelle AduPoole, Shalonda wrote: Reason for CRM: Patient mother is calling to schedule labs for the patient for school. Her best contact number is [phone]. Please advise

## 2018-02-02 NOTE — Telephone Encounter (Signed)
I called the pts mother and she stated his school requires he have a TB skin test.  Appt scheduled for 8/5.

## 2018-02-06 ENCOUNTER — Ambulatory Visit (INDEPENDENT_AMBULATORY_CARE_PROVIDER_SITE_OTHER): Payer: BLUE CROSS/BLUE SHIELD | Admitting: Family Medicine

## 2018-02-06 ENCOUNTER — Other Ambulatory Visit: Payer: Self-pay | Admitting: Family Medicine

## 2018-02-06 ENCOUNTER — Other Ambulatory Visit (INDEPENDENT_AMBULATORY_CARE_PROVIDER_SITE_OTHER): Payer: BLUE CROSS/BLUE SHIELD

## 2018-02-06 DIAGNOSIS — R739 Hyperglycemia, unspecified: Secondary | ICD-10-CM | POA: Diagnosis not present

## 2018-02-06 DIAGNOSIS — E785 Hyperlipidemia, unspecified: Secondary | ICD-10-CM | POA: Diagnosis not present

## 2018-02-06 DIAGNOSIS — Z8619 Personal history of other infectious and parasitic diseases: Secondary | ICD-10-CM | POA: Diagnosis not present

## 2018-02-06 DIAGNOSIS — Z23 Encounter for immunization: Secondary | ICD-10-CM

## 2018-02-06 LAB — LDL CHOLESTEROL, DIRECT: Direct LDL: 119 mg/dL

## 2018-02-06 LAB — BASIC METABOLIC PANEL
BUN: 15 mg/dL (ref 6–23)
CHLORIDE: 103 meq/L (ref 96–112)
CO2: 28 meq/L (ref 19–32)
Calcium: 10.5 mg/dL (ref 8.4–10.5)
Creatinine, Ser: 0.87 mg/dL (ref 0.40–1.50)
GFR: 113.72 mL/min (ref >60.00)
GLUCOSE: 105 mg/dL — AB (ref 70–99)
POTASSIUM: 4.4 meq/L (ref 3.5–5.1)
SODIUM: 141 meq/L (ref 135–145)

## 2018-02-06 LAB — HEMOGLOBIN A1C: Hgb A1c MFr Bld: 5.2 % (ref 4.6–6.5)

## 2018-02-06 LAB — LIPID PANEL
Cholesterol: 207 mg/dL — ABNORMAL HIGH (ref 0–200)
HDL: 31 mg/dL — ABNORMAL LOW (ref >39.00)
Total CHOL/HDL Ratio: 7

## 2018-02-07 ENCOUNTER — Encounter: Payer: Self-pay | Admitting: *Deleted

## 2018-02-07 LAB — VARICELLA ZOSTER ANTIBODY, IGG: Varicella IgG: 913.9 index

## 2018-02-08 ENCOUNTER — Encounter: Payer: Self-pay | Admitting: *Deleted

## 2018-02-08 LAB — TB SKIN TEST
Induration: 0 mm
TB SKIN TEST: NEGATIVE

## 2018-02-08 NOTE — Telephone Encounter (Signed)
Thank you for the quick processing of the lab results, but I was under the impression that I was getting blood work for a varicella titer to prove that I was immune to the chicken pox. I may be missing the results in the message as I am new to the my chart system. As for the high glucose, since I wasn't expecting lipid blood work, not only did I not fast, I had a sweet coffee on my way to the actual test, so Im not sure if that is going to affect the results. I was under the impression that my lipid blood work would have been done later in the year. Thank you for your time and assistance.  -Christian Lucas   Dr. Marcine MatarBurchette--the above is a message back from the patient about his lab results.  Please advise.   Thank you.

## 2018-09-06 ENCOUNTER — Other Ambulatory Visit: Payer: Self-pay

## 2018-09-06 MED ORDER — SUMATRIPTAN SUCCINATE 100 MG PO TABS: ORAL_TABLET | ORAL | 6 refills | Status: DC

## 2018-09-14 ENCOUNTER — Encounter: Payer: Self-pay | Admitting: Family Medicine

## 2018-09-18 ENCOUNTER — Ambulatory Visit (INDEPENDENT_AMBULATORY_CARE_PROVIDER_SITE_OTHER): Payer: BLUE CROSS/BLUE SHIELD | Admitting: Family Medicine

## 2018-09-18 ENCOUNTER — Encounter: Payer: Self-pay | Admitting: Family Medicine

## 2018-09-18 ENCOUNTER — Other Ambulatory Visit: Payer: Self-pay

## 2018-09-18 VITALS — BP 118/76 | HR 85 | Temp 98.4°F | Wt 209.6 lb

## 2018-09-18 DIAGNOSIS — Z113 Encounter for screening for infections with a predominantly sexual mode of transmission: Secondary | ICD-10-CM

## 2018-09-18 DIAGNOSIS — E785 Hyperlipidemia, unspecified: Secondary | ICD-10-CM | POA: Diagnosis not present

## 2018-09-18 DIAGNOSIS — G43909 Migraine, unspecified, not intractable, without status migrainosus: Secondary | ICD-10-CM | POA: Diagnosis not present

## 2018-09-18 DIAGNOSIS — M25561 Pain in right knee: Secondary | ICD-10-CM

## 2018-09-18 LAB — HEMOGLOBIN A1C: Hgb A1c MFr Bld: 5.3 % (ref 4.6–6.5)

## 2018-09-18 LAB — LIPID PANEL
CHOL/HDL RATIO: 6
CHOLESTEROL: 278 mg/dL — AB (ref 0–200)
HDL: 43.7 mg/dL (ref >39.00)
NonHDL: 234.73
TRIGLYCERIDES: 280 mg/dL — AB (ref 0.0–149.0)
VLDL: 56 mg/dL — ABNORMAL HIGH (ref 0.0–40.0)

## 2018-09-18 LAB — LDL CHOLESTEROL, DIRECT: Direct LDL: 175 mg/dL

## 2018-09-18 MED ORDER — SUMATRIPTAN SUCCINATE 100 MG PO TABS: ORAL_TABLET | ORAL | 3 refills | Status: DC

## 2018-09-18 NOTE — Patient Instructions (Signed)

## 2018-09-18 NOTE — Progress Notes (Signed)
Subjective:     Patient ID: Christian Lucas, male   DOB: [birth year], 28 y.o.   MRN: [id number]  HPI Patient is seen today for multiple issues as follows  History of migraine headaches.  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  Very stressful.  Spends a lot of late night studying.  Imitrex works well.  We had him on Topamax we had some side effects.  He declines preventative medication at this time.  Has about 2 migraines generally per week.  Patient has had some right knee pain.  Back in December he was doing some sprints and felt a popping sensation posterior right knee.  He recently tried to start running again had some pain.  No swelling.  No ecchymosis.  No sense of instability.  Pain remains mostly posterior.  Patient has concerns for possible adult ADD.  Never formally tested.  He has difficulty focusing and staying on track.  He has found this particular challenge and was his recent pharmacy classes have been discontinued.  They are doing home study which he is found to be challenging.  He had questions about possible stimulant medication.  Patient has history of dyslipidemia.  High triglycerides over 600.  He has made some dietary changes.  Requesting follow-up fasting lipids today  Patient requesting STD screening.  No history of STD.  No dysuria.  No rashes.  No past medical history on file. Past Surgical History:  Procedure Laterality Date  . TONSILLECTOMY    . TYMPANOSTOMY TUBE PLACEMENT      reports that he has never smoked. He has never used smokeless tobacco. He reports current alcohol use. He reports that he does not use drugs. family history includes Cancer (age of onset: 20) in his maternal grandfather; Heart disease (age of onset: 30) in his father; Hyperlipidemia in his father; Hypertension in his father. No Known Allergies   Review of Systems  Constitutional: Negative for chills, fatigue and fever.  Eyes: Negative for visual disturbance.   Respiratory: Negative for cough, chest tightness and shortness of breath.   Cardiovascular: Negative for chest pain, palpitations and leg swelling.  Gastrointestinal: Negative for abdominal pain.  Neurological: Negative for dizziness, syncope, weakness, light-headedness and headaches.  Psychiatric/Behavioral: Negative for dysphoric mood.       Objective:   Physical Exam Constitutional:      Appearance: He is well-developed.  HENT:     Right Ear: External ear normal.     Left Ear: External ear normal.  Eyes:     Pupils: Pupils are equal, round, and reactive to light.  Neck:     Musculoskeletal: Neck supple.     Thyroid: No thyromegaly.  Cardiovascular:     Rate and Rhythm: Normal rate and regular rhythm.  Pulmonary:     Effort: Pulmonary effort is normal. No respiratory distress.     Breath sounds: Normal breath sounds. No wheezing or rales.  Musculoskeletal:     Comments: Right knee full range of motion.  No localized tenderness.  No effusion.  Ligament testing is normal.  No medial or lateral joint line tenderness.  He has some mild pain with leg flexion against resistance  Neurological:     Mental Status: He is alert and oriented to person, place, and time.        Assessment:     #1 history of migraine headaches.  Requesting refills of Imitrex.  He declines prophylaxis  #2 right knee pain.  Suspect he may have had  right hamstring injury.  Nonfocal exam at this time.  #3 concern for possible adult ADD.  Never tested  #4 history of dyslipidemia with high triglycerides and low HDL.  At risk for type 2 diabetes  #5 STD screening.  Patient is requesting this.  No symptoms    Plan:     -Check RPR, HIV, GC, and Chlamydia screens -We recommend he set up follow-up with clinical psychologist for further testing regarding possible ADD. -Recheck fasting lipid panel -Refill Imitrex for 1 year -Recommend some gentle stretching for his hamstrings and try to gradually resume  running.  If unable consider sports medicine follow-up for further assessment of his right posterior knee pain.  Kristian Covey MD [facility] at [facility]

## 2018-09-19 ENCOUNTER — Encounter: Payer: Self-pay | Admitting: Family Medicine

## 2018-09-19 LAB — C. TRACHOMATIS/N. GONORRHOEAE RNA
C. trachomatis RNA, TMA: NOT DETECTED
N. gonorrhoeae RNA, TMA: NOT DETECTED

## 2018-09-19 LAB — RPR: RPR Ser Ql: NONREACTIVE

## 2018-09-19 LAB — HIV ANTIBODY (ROUTINE TESTING W REFLEX): HIV 1&2 Ab, 4th Generation: NONREACTIVE

## 2018-09-20 ENCOUNTER — Encounter: Payer: Self-pay | Admitting: Family Medicine

## 2018-10-03 ENCOUNTER — Ambulatory Visit (INDEPENDENT_AMBULATORY_CARE_PROVIDER_SITE_OTHER): Payer: BLUE CROSS/BLUE SHIELD | Admitting: Psychology

## 2018-10-03 DIAGNOSIS — F909 Attention-deficit hyperactivity disorder, unspecified type: Secondary | ICD-10-CM

## 2018-10-17 ENCOUNTER — Ambulatory Visit (INDEPENDENT_AMBULATORY_CARE_PROVIDER_SITE_OTHER): Payer: BLUE CROSS/BLUE SHIELD | Admitting: Psychology

## 2018-10-17 DIAGNOSIS — F902 Attention-deficit hyperactivity disorder, combined type: Secondary | ICD-10-CM | POA: Diagnosis not present

## 2018-10-17 DIAGNOSIS — F411 Generalized anxiety disorder: Secondary | ICD-10-CM

## 2018-11-02 ENCOUNTER — Ambulatory Visit (INDEPENDENT_AMBULATORY_CARE_PROVIDER_SITE_OTHER): Payer: BLUE CROSS/BLUE SHIELD | Admitting: Psychology

## 2018-11-02 ENCOUNTER — Encounter: Payer: Self-pay | Admitting: Family Medicine

## 2018-11-02 DIAGNOSIS — F411 Generalized anxiety disorder: Secondary | ICD-10-CM | POA: Diagnosis not present

## 2018-11-02 DIAGNOSIS — F900 Attention-deficit hyperactivity disorder, predominantly inattentive type: Secondary | ICD-10-CM

## 2018-11-02 DIAGNOSIS — F324 Major depressive disorder, single episode, in partial remission: Secondary | ICD-10-CM | POA: Diagnosis not present

## 2018-11-03 ENCOUNTER — Telehealth: Payer: Self-pay

## 2018-11-03 NOTE — Telephone Encounter (Signed)
Called patient and left a voice message to let him know that we have not received the paperwork that is going to be faxed to [place] and I will reach out to him on Monday to see if we received and set up a virtual visit.  CRM Created.

## 2018-11-08 ENCOUNTER — Ambulatory Visit: Payer: BLUE CROSS/BLUE SHIELD | Admitting: Family Medicine

## 2018-11-08 ENCOUNTER — Ambulatory Visit (INDEPENDENT_AMBULATORY_CARE_PROVIDER_SITE_OTHER): Payer: Self-pay | Admitting: Family Medicine

## 2018-11-08 ENCOUNTER — Other Ambulatory Visit: Payer: Self-pay

## 2018-11-08 DIAGNOSIS — F988 Other specified behavioral and emotional disorders with onset usually occurring in childhood and adolescence: Secondary | ICD-10-CM

## 2018-11-08 MED ORDER — AMPHETAMINE-DEXTROAMPHETAMINE 10 MG PO TABS: 10.0000 mg | ORAL_TABLET | Freq: Two times a day (BID) | ORAL | 0 refills | Status: DC

## 2018-11-08 NOTE — Progress Notes (Signed)
Patient ID: Christian Lucas, male   DOB: 06-12-1991, 28 y.o.   MRN: [id number]  This visit type was conducted due to national recommendations for restrictions regarding the COVID-19 pandemic in an effort to limit this patient's exposure and mitigate transmission in our community.   Virtual Visit via Video Note  I connected with Christian Lucas on 11/08/18 at  9:45 AM EDT by a video enabled telemedicine application and verified that I am speaking with the correct person using two identifiers.  Location patient: home Location provider:work or home office Persons participating in the virtual visit: patient, provider  I discussed the limitations of evaluation and management by telemedicine and the availability of in person appointments. The patient expressed understanding and agreed to proceed.   HPI: Follow-up regarding attention deficit issues.  He had full evaluation with our clinical psychologist.  He did meet criteria for attention deficit disorder at least moderate to severe degree.  He also met criteria for generalized anxiety disorder.  He thinks some of anxiety issues are related to his ADD.  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  He has discussed nonpharmacologic interventions with our clinical psychologist but would also like to consider medication options.  He has never been treated previously.  He does not take any regular medications.  He has Imitrex which he takes for as needed migraines.   ROS: See pertinent positives and negatives per HPI.  No past medical history on file.  Past Surgical History:  Procedure Laterality Date  . TONSILLECTOMY    . TYMPANOSTOMY TUBE PLACEMENT      Family History  Problem Relation Age of Onset  . Hypertension Father   . Hyperlipidemia Father   . Heart disease Father 20       several stents.  . Cancer Maternal Grandfather 64       colon cancer    SOCIAL HX: Non-smoker.  He is in pharmacy school at  [employer]   Current Outpatient Medications:  .  amphetamine-dextroamphetamine (ADDERALL) 10 MG tablet, Take 1 tablet (10 mg total) by mouth 2 (two) times daily., Disp: 60 tablet, Rfl: 0 .  ibuprofen (ADVIL,MOTRIN) 200 MG tablet, Take 200 mg by mouth every 6 (six) hours as needed for fever, headache, mild pain, moderate pain or cramping., Disp: , Rfl:  .  SUMAtriptan (IMITREX) 100 MG tablet, TAKE 1 TABLET BY MOUTH EVERY 2 HOURS AS NEEDED FOR  MIGRAINE  MAY  REPEAT  IN  2  HOURS  IF  HEADACHE  PERSISTS  OR  RECURS, Disp: 27 tablet, Rfl: 3  EXAM:  VITALS per patient if applicable:  GENERAL: alert, oriented, appears well and in no acute distress  HEENT: atraumatic, conjunttiva clear, no obvious abnormalities on inspection of external nose and ears  NECK: normal movements of the head and neck  LUNGS: on inspection no signs of respiratory distress, breathing rate appears normal, no obvious gross SOB, gasping or wheezing  CV: no obvious cyanosis  MS: moves all visible extremities without noticeable abnormality  PSYCH/NEURO: pleasant and cooperative, no obvious depression or anxiety, speech and thought processing grossly intact  ASSESSMENT AND PLAN:  Discussed the following assessment and plan:  Attention deficit disorder -Long discussion regarding potential medication options.  We discussed non-stimulant options such as Strattera.  We explained that these take some time to have affect and also are somewhat less predictable. -We discussed stimulant options and have agreed to try Adderall 10 mg twice daily.  We  reviewed potential side effects such as dry mouth and insomnia.  He will be in touch in 1 month to give [place] feedback and we can consider further titration of dosing at that time if necessary     I discussed the assessment and treatment plan with the patient. The patient was provided an opportunity to ask questions and all were answered. The patient agreed with the plan and  demonstrated an understanding of the instructions.   The patient was advised to call back or seek an in-person evaluation if the symptoms worsen or if the condition fails to improve as anticipated.     Carolann Littler, MD

## 2018-11-30 ENCOUNTER — Other Ambulatory Visit: Payer: Self-pay | Admitting: Family Medicine

## 2018-12-01 NOTE — Telephone Encounter (Signed)
Last OV 11/08/18, No future OV  Last filled 11/08/18, # 60 with 0 refills

## 2018-12-06 MED ORDER — AMPHETAMINE-DEXTROAMPHETAMINE 10 MG PO TABS: ORAL_TABLET | ORAL | 0 refills | Status: DC

## 2018-12-06 MED ORDER — AMPHETAMINE-DEXTROAMPHETAMINE 10 MG PO TABS: 10.0000 mg | ORAL_TABLET | Freq: Two times a day (BID) | ORAL | 0 refills | Status: DC

## 2018-12-06 NOTE — Telephone Encounter (Signed)
Pt called upset that his medication was not called and and wanted to know.  Pt was on the phone and he hung up.

## 2018-12-06 NOTE — Telephone Encounter (Signed)
Please see new message. Patient is requesting this medication

## 2018-12-06 NOTE — Telephone Encounter (Signed)
Pt called back to make sure that he will be able to get his refill on time he stated that he has only 2 pills left and that he sent the msg in early so that it could get addressed before he runs out.

## 2018-12-06 NOTE — Telephone Encounter (Signed)
Refills sent

## 2018-12-11 ENCOUNTER — Ambulatory Visit: Payer: Self-pay | Admitting: Family Medicine

## 2018-12-13 ENCOUNTER — Other Ambulatory Visit: Payer: Self-pay

## 2018-12-13 ENCOUNTER — Ambulatory Visit (INDEPENDENT_AMBULATORY_CARE_PROVIDER_SITE_OTHER): Payer: BC Managed Care – PPO | Admitting: *Deleted

## 2018-12-13 DIAGNOSIS — Z23 Encounter for immunization: Secondary | ICD-10-CM

## 2018-12-15 LAB — TB SKIN TEST
Induration: 3 mm
TB Skin Test: NEGATIVE

## 2019-03-03 ENCOUNTER — Other Ambulatory Visit: Payer: Self-pay | Admitting: Family Medicine

## 2019-03-06 ENCOUNTER — Telehealth: Payer: Self-pay | Admitting: Family Medicine

## 2019-03-06 MED ORDER — AMPHETAMINE-DEXTROAMPHETAMINE 10 MG PO TABS: ORAL_TABLET | ORAL | 0 refills | Status: DC

## 2019-03-06 MED ORDER — AMPHETAMINE-DEXTROAMPHETAMINE 10 MG PO TABS: 10.0000 mg | ORAL_TABLET | Freq: Two times a day (BID) | ORAL | 0 refills | Status: DC

## 2019-03-06 NOTE — Telephone Encounter (Signed)
Please see Rx refill request

## 2019-03-06 NOTE — Telephone Encounter (Signed)
Refills sent

## 2019-03-06 NOTE — Telephone Encounter (Signed)
Patient requesting refill. 

## 2019-03-08 ENCOUNTER — Telehealth: Payer: Self-pay | Admitting: *Deleted

## 2019-03-08 ENCOUNTER — Telehealth: Payer: Self-pay | Admitting: Family Medicine

## 2019-03-08 NOTE — Telephone Encounter (Signed)
'  Christian Lucas' with [facility] # [phone] pharmacy calling to request RX of adderall be called in for 20mg  tab and pt to take 1/2 tab. States they do not have 10mg  tabs. Aware practice is closed presently. States can be addressed tomorrow.

## 2019-03-08 NOTE — Telephone Encounter (Signed)
Copied from [place] [id number]. Topic: General - Other >> Mar 07, 2019  5:36 PM Mcneil, Ja-Kwan wrote: Reason for CRM: Pt stated he was advised by his pharmacy that the Rx for amphetamine-dextroamphetamine (ADDERALL) 10 MG tablet is on back order. Pt stated the dosage would need to be changed. >> Mar 08, 2019  3:02 PM Celene Kras A wrote: Pt called and is requesting to have this resolved. Pt is going on third day without medication. Please advise.

## 2019-03-09 MED ORDER — AMPHETAMINE-DEXTROAMPHETAMINE 20 MG PO TABS: ORAL_TABLET | ORAL | 0 refills | Status: DC

## 2019-03-09 NOTE — Telephone Encounter (Signed)
Please see message. °

## 2019-03-09 NOTE — Telephone Encounter (Signed)
See request °

## 2019-03-09 NOTE — Telephone Encounter (Signed)
Please see new message

## 2019-03-09 NOTE — Telephone Encounter (Signed)
I sent in for the 20 mg - take one half tablet twice daily.   We had [facility] on [address] listed- so I hope that is the right pharmacy.  We will have to see by next month if shortage of Adderall 10 mg is resolved.

## 2019-03-09 NOTE — Telephone Encounter (Signed)
See note

## 2019-03-09 NOTE — Telephone Encounter (Signed)
Pt calling again and is requesting to have this sent in. Pt states the pharmacy told him they only have the 20mg  available and that he could take half a tablet. Pt is requesting to have this done by the end of the day since it is a long weekend. Please advise.

## 2019-03-09 NOTE — Telephone Encounter (Signed)
This may be a duplicate

## 2019-03-09 NOTE — Telephone Encounter (Signed)
It would be enormously helpful if pharmacy would give [place] alternative of what is available (eg is 5 mg available?  If so we can have him take two fives)

## 2019-03-13 NOTE — Telephone Encounter (Signed)
Pt called investigating status of refill, needs Rx sent to pharmacy Christian Lucas, [place]) listed below

## 2019-03-13 NOTE — Telephone Encounter (Signed)
Called patient and he was upset because he had to wait 8 days without his medication and there were 2 separate notes about the same medication request and not all on one telephone encounter and it became confusing. Patient verbalized an understanding and stated that the 10 mg Adderall was picked up today.

## 2019-03-13 NOTE — Telephone Encounter (Signed)
Please see new message from patient

## 2019-03-13 NOTE — Telephone Encounter (Signed)
Please clarify. These prescriptions (Adderall 10 mg)  were sent to pharmacy in [place] on 03-06-19

## 2019-06-06 ENCOUNTER — Other Ambulatory Visit: Payer: Self-pay | Admitting: Family Medicine

## 2019-06-07 MED ORDER — AMPHETAMINE-DEXTROAMPHETAMINE 10 MG PO TABS: 10.0000 mg | ORAL_TABLET | Freq: Two times a day (BID) | ORAL | 0 refills | Status: DC

## 2019-06-07 MED ORDER — AMPHETAMINE-DEXTROAMPHETAMINE 10 MG PO TABS: ORAL_TABLET | ORAL | 0 refills | Status: DC

## 2019-09-04 ENCOUNTER — Other Ambulatory Visit: Payer: Self-pay | Admitting: Family Medicine

## 2019-09-04 MED ORDER — AMPHETAMINE-DEXTROAMPHETAMINE 10 MG PO TABS: ORAL_TABLET | ORAL | 0 refills | Status: DC

## 2019-09-04 MED ORDER — AMPHETAMINE-DEXTROAMPHETAMINE 10 MG PO TABS: 10.0000 mg | ORAL_TABLET | Freq: Two times a day (BID) | ORAL | 0 refills | Status: DC

## 2019-09-04 NOTE — Telephone Encounter (Signed)
Forwarding to PCP.

## 2019-09-27 ENCOUNTER — Other Ambulatory Visit: Payer: Self-pay

## 2019-09-28 ENCOUNTER — Ambulatory Visit (INDEPENDENT_AMBULATORY_CARE_PROVIDER_SITE_OTHER): Payer: PRIVATE HEALTH INSURANCE | Admitting: *Deleted

## 2019-09-28 DIAGNOSIS — Z23 Encounter for immunization: Secondary | ICD-10-CM

## 2019-09-28 DIAGNOSIS — Z111 Encounter for screening for respiratory tuberculosis: Secondary | ICD-10-CM

## 2019-09-28 NOTE — Progress Notes (Signed)
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 TB placed in left arm. Patient will return on Monday morning ofr reading.

## 2019-10-01 LAB — TB SKIN TEST
Induration: 0 mm
TB Skin Test: NEGATIVE

## 2019-10-15 ENCOUNTER — Other Ambulatory Visit: Payer: Self-pay | Admitting: Family Medicine

## 2019-12-05 ENCOUNTER — Other Ambulatory Visit: Payer: Self-pay | Admitting: Family Medicine

## 2019-12-05 MED ORDER — SUMATRIPTAN SUCCINATE 100 MG PO TABS: ORAL_TABLET | ORAL | 0 refills | Status: DC

## 2019-12-05 NOTE — Telephone Encounter (Signed)
Message routed to PCP CMA  

## 2019-12-10 ENCOUNTER — Telehealth (INDEPENDENT_AMBULATORY_CARE_PROVIDER_SITE_OTHER): Payer: PRIVATE HEALTH INSURANCE | Admitting: Family Medicine

## 2019-12-10 DIAGNOSIS — F988 Other specified behavioral and emotional disorders with onset usually occurring in childhood and adolescence: Secondary | ICD-10-CM

## 2019-12-10 MED ORDER — AMPHETAMINE-DEXTROAMPHETAMINE 10 MG PO TABS: 10.0000 mg | ORAL_TABLET | Freq: Two times a day (BID) | ORAL | 0 refills | Status: DC

## 2019-12-10 MED ORDER — AMPHETAMINE-DEXTROAMPHETAMINE 10 MG PO TABS: ORAL_TABLET | ORAL | 0 refills | Status: DC

## 2019-12-10 NOTE — Progress Notes (Signed)
Patient ID: Christian Lucas, male   DOB: [birth year], 29 y.o.   MRN: [id number]   This visit type was conducted due to national recommendations for restrictions regarding the COVID-19 pandemic in an effort to limit this patient's exposure and mitigate transmission in our community.   Virtual Visit via Video Note  I connected with Christian Lucas on 12/10/19 at  9:45 AM EDT by a video enabled telemedicine application and verified that I am speaking with the correct person using two identifiers.  Location patient: home Location provider:work or home office Persons participating in the virtual visit: patient, provider  I discussed the limitations of evaluation and management by telemedicine and the availability of in person appointments. The patient expressed understanding and agreed to proceed.   HPI: Christian Lucas is seen for follow-up regarding ADD.  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  He feels like things are going well.  He is on Adderall 10 mg twice daily.  His dose seems to work well for him.  No major insomnia.  No major appetite suppression.  No regular headaches.  He tries to take days off when possible depending on his study and work schedule   ROS: See pertinent positives and negatives per HPI.  No past medical history on file.  Past Surgical History:  Procedure Laterality Date  . TONSILLECTOMY    . TYMPANOSTOMY TUBE PLACEMENT      Family History  Problem Relation Age of Onset  . Hypertension Father   . Hyperlipidemia Father   . Heart disease Father 73       several stents.  . Cancer Maternal Grandfather 67       colon cancer    SOCIAL HX: Non-smoker   Current Outpatient Medications:  .  amphetamine-dextroamphetamine (ADDERALL) 10 MG tablet, Take one tablet two times daily.  May refill in two months., Disp: 60 tablet, Rfl: 0 .  amphetamine-dextroamphetamine (ADDERALL) 10 MG tablet, Take one tablet two times daily.   May refill in one month., Disp: 60 tablet, Rfl: 0 .  amphetamine-dextroamphetamine (ADDERALL) 10 MG tablet, Take 1 tablet (10 mg total) by mouth 2 (two) times daily., Disp: 60 tablet, Rfl: 0 .  ibuprofen (ADVIL,MOTRIN) 200 MG tablet, Take 200 mg by mouth every 6 (six) hours as needed for fever, headache, mild pain, moderate pain or cramping., Disp: , Rfl:  .  SUMAtriptan (IMITREX) 100 MG tablet, TAKE 1 TABLET BY MOUTH EVERY 2 HOURS AS NEEDED FOR  MIGRAINE  MAY  REPEAT  IN  2  HOURS  IF  HEADACHE  PERSISTS  OR  RECURS.NEEDS OFFICE VISIT FOR FURTHER REFILLS, Disp: 9 tablet, Rfl: 0  EXAM:  VITALS per patient if applicable:  GENERAL: alert, oriented, appears well and in no acute distress  HEENT: atraumatic, conjunttiva clear, no obvious abnormalities on inspection of external nose and ears  NECK: normal movements of the head and neck  LUNGS: on inspection no signs of respiratory distress, breathing rate appears normal, no obvious gross SOB, gasping or wheezing  CV: no obvious cyanosis  MS: moves all visible extremities without noticeable abnormality  PSYCH/NEURO: pleasant and cooperative, no obvious depression or anxiety, speech and thought processing grossly intact  ASSESSMENT AND PLAN:  Discussed the following assessment and plan:  Attention deficit disorder -stable  -Refill Adderall 10 mg twice daily for 3 months     I discussed the assessment and treatment plan with the patient. The patient was provided an opportunity  to ask questions and all were answered. The patient agreed with the plan and demonstrated an understanding of the instructions.   The patient was advised to call back or seek an in-person evaluation if the symptoms worsen or if the condition fails to improve as anticipated.     Carolann Littler, MD

## 2020-02-11 ENCOUNTER — Other Ambulatory Visit: Payer: Self-pay | Admitting: Family Medicine

## 2020-02-12 MED ORDER — SUMATRIPTAN SUCCINATE 100 MG PO TABS: ORAL_TABLET | ORAL | 1 refills | Status: DC

## 2020-03-06 ENCOUNTER — Other Ambulatory Visit: Payer: Self-pay | Admitting: Family Medicine

## 2020-03-07 MED ORDER — AMPHETAMINE-DEXTROAMPHETAMINE 10 MG PO TABS: ORAL_TABLET | ORAL | 0 refills | Status: DC

## 2020-03-07 MED ORDER — AMPHETAMINE-DEXTROAMPHETAMINE 10 MG PO TABS
10.0000 mg | ORAL_TABLET | Freq: Two times a day (BID) | ORAL | 0 refills | Status: DC
Start: 2020-03-07 — End: 2020-06-08

## 2020-03-07 NOTE — Telephone Encounter (Signed)
Last OV was 12/2019

## 2020-04-28 ENCOUNTER — Other Ambulatory Visit: Payer: Self-pay | Admitting: Family Medicine

## 2020-06-08 ENCOUNTER — Other Ambulatory Visit: Payer: Self-pay | Admitting: Family Medicine

## 2020-06-09 MED ORDER — AMPHETAMINE-DEXTROAMPHETAMINE 10 MG PO TABS
ORAL_TABLET | ORAL | 0 refills | Status: DC
Start: 2020-06-09 — End: 2020-09-05

## 2020-06-09 MED ORDER — AMPHETAMINE-DEXTROAMPHETAMINE 10 MG PO TABS
10.0000 mg | ORAL_TABLET | Freq: Two times a day (BID) | ORAL | 0 refills | Status: DC
Start: 2020-06-09 — End: 2020-09-05

## 2020-07-11 ENCOUNTER — Other Ambulatory Visit: Payer: Self-pay | Admitting: Family Medicine

## 2020-08-05 ENCOUNTER — Other Ambulatory Visit: Payer: Self-pay | Admitting: Family Medicine

## 2020-09-05 ENCOUNTER — Other Ambulatory Visit: Payer: Self-pay | Admitting: Family Medicine

## 2020-09-05 MED ORDER — AMPHETAMINE-DEXTROAMPHETAMINE 10 MG PO TABS
ORAL_TABLET | ORAL | 0 refills | Status: DC
Start: 2020-09-05 — End: 2020-12-07

## 2020-09-05 MED ORDER — AMPHETAMINE-DEXTROAMPHETAMINE 10 MG PO TABS
10.0000 mg | ORAL_TABLET | Freq: Two times a day (BID) | ORAL | 0 refills | Status: DC
Start: 2020-09-05 — End: 2020-12-07

## 2020-09-05 NOTE — Telephone Encounter (Signed)
Last video visit- 12/10/2019 Last refill- 06/19/2020--60 tabs  No future appointment scheduled

## 2020-12-07 ENCOUNTER — Other Ambulatory Visit: Payer: Self-pay | Admitting: Family Medicine

## 2020-12-08 MED ORDER — AMPHETAMINE-DEXTROAMPHETAMINE 10 MG PO TABS: 10.0000 mg | ORAL_TABLET | Freq: Two times a day (BID) | ORAL | 0 refills | Status: DC

## 2020-12-08 MED ORDER — AMPHETAMINE-DEXTROAMPHETAMINE 10 MG PO TABS: ORAL_TABLET | ORAL | 0 refills | Status: DC

## 2020-12-08 MED ORDER — SUMATRIPTAN SUCCINATE 100 MG PO TABS: ORAL_TABLET | ORAL | 0 refills | Status: DC

## 2020-12-08 NOTE — Telephone Encounter (Signed)
Imitrex sent in.   adderall last filled 09/05/2020 Upcoming appointment tomorrow.   Ok to fill?

## 2021-02-03 ENCOUNTER — Other Ambulatory Visit: Payer: Self-pay | Admitting: Family Medicine

## 2021-02-16 ENCOUNTER — Ambulatory Visit (INDEPENDENT_AMBULATORY_CARE_PROVIDER_SITE_OTHER): Payer: No Typology Code available for payment source | Admitting: Family Medicine

## 2021-02-16 ENCOUNTER — Other Ambulatory Visit: Payer: Self-pay

## 2021-02-16 ENCOUNTER — Encounter: Payer: Self-pay | Admitting: Family Medicine

## 2021-02-16 VITALS — BP 115/78 | HR 115 | Temp 98.7°F | Wt 188.1 lb

## 2021-02-16 DIAGNOSIS — G43909 Migraine, unspecified, not intractable, without status migrainosus: Secondary | ICD-10-CM | POA: Diagnosis not present

## 2021-02-16 DIAGNOSIS — F988 Other specified behavioral and emotional disorders with onset usually occurring in childhood and adolescence: Secondary | ICD-10-CM | POA: Diagnosis not present

## 2021-02-16 DIAGNOSIS — E785 Hyperlipidemia, unspecified: Secondary | ICD-10-CM | POA: Insufficient documentation

## 2021-02-16 MED ORDER — SUMATRIPTAN SUCCINATE 100 MG PO TABS: ORAL_TABLET | ORAL | 11 refills | Status: DC

## 2021-02-16 NOTE — Progress Notes (Signed)
Established Patient Office Visit  Subjective:  Patient ID: Christian Lucas, male    DOB: 23-Jan-1991  Age: 30 y.o. MRN: [id number]  CC:  Chief Complaint  Patient presents with   Medication Refill    adderall    HPI Christian Lucas presents for medical follow-up.  He has ADD and takes Adderall 10 mg twice daily.  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  He feels like current dose of Adderall is working well.  No significant insomnia.  He has battled with stress eating in the past and his weight but is currently trying to exercise and has made some recent dietary changes.  He has history of migraine headaches.  Has used Imitrex periodically and requesting refills.  Imitrex continues to work fairly well for him.  History of fairly severe hyperlipidemia.  He was not fasting with last lipids.  Last cholesterol 278 with HDL 43 and triglycerides of 280 with LDL cholesterol 175.  His father does have history of CAD with stents in his early 81s.  No past medical history on file.  Past Surgical History:  Procedure Laterality Date   TONSILLECTOMY     TYMPANOSTOMY TUBE PLACEMENT      Family History  Problem Relation Age of Onset   Hypertension Father    Hyperlipidemia Father    Heart disease Father 81       several stents.   Cancer Maternal Grandfather 46       colon cancer    Social History   Socioeconomic History   Marital status: Single    Spouse name: Not on file   Number of children: Not on file   Years of education: Not on file   Highest education level: Not on file  Occupational History   Not on file  Tobacco Use   Smoking status: Never   Smokeless tobacco: Never  Substance and Sexual Activity   Alcohol use: Yes    Comment: social   Drug use: No   Sexual activity: Not on file  Other Topics Concern   Not on file  Social History Narrative   Not on file   Social Determinants of Health    Financial Resource Strain: Not on file  Food Insecurity: Not on file  Transportation Needs: Not on file  Physical Activity: Not on file  Stress: Not on file  Social Connections: Not on file  Intimate Partner Violence: Not on file    Outpatient Medications Prior to Visit  Medication Sig Dispense Refill   amphetamine-dextroamphetamine (ADDERALL) 10 MG tablet Take one tablet two times daily.  May refill in two months. 60 tablet 0   amphetamine-dextroamphetamine (ADDERALL) 10 MG tablet Take one tablet two times daily.  May refill in one month. 60 tablet 0   amphetamine-dextroamphetamine (ADDERALL) 10 MG tablet Take 1 tablet (10 mg total) by mouth 2 (two) times daily. 60 tablet 0   ibuprofen (ADVIL,MOTRIN) 200 MG tablet Take 200 mg by mouth every 6 (six) hours as needed for fever, headache, mild pain, moderate pain or cramping.     SUMAtriptan (IMITREX) 100 MG tablet May repeat in 2 hours if headache persists or recurs. 9 tablet 0   SUMAtriptan (IMITREX) 100 MG tablet TAKE 1 TABLET BY MOUTH EVERY 2 HOURS AS NEEDED FOR MIGRAINE MAY REPEAT IN 2 HOURS IF HEADACHE PERSISTS OR RECURS 9 tablet 0   No facility-administered medications prior to visit.    No  Known Allergies  ROS Review of Systems  Constitutional:  Negative for fatigue.  Eyes:  Negative for visual disturbance.  Respiratory:  Negative for cough, chest tightness and shortness of breath.   Cardiovascular:  Negative for chest pain, palpitations and leg swelling.  Neurological:  Negative for dizziness, syncope, weakness, light-headedness and headaches.     Objective:    Physical Exam Vitals reviewed.  Constitutional:      Appearance: Normal appearance.  Cardiovascular:     Rate and Rhythm: Normal rate and regular rhythm.  Pulmonary:     Effort: Pulmonary effort is normal.     Breath sounds: Normal breath sounds.  Neurological:     Mental Status: He is alert.    BP 115/78 (BP Location: Left Arm, Cuff Size: Normal)    Pulse (!) 115   Temp 98.7 F (37.1 C) (Oral)   Wt 188 lb 1.6 oz (85.3 kg)   SpO2 98%   BMI 30.36 kg/m  Wt Readings from Last 3 Encounters:  02/16/21 188 lb 1.6 oz (85.3 kg)  09/18/18 209 lb 9.6 oz (95.1 kg)  01/24/18 210 lb 4.8 oz (95.4 kg)     Health Maintenance Due  Topic Date Due   COVID-19 Vaccine (1) Never done   Hepatitis C Screening  Never done   INFLUENZA VACCINE  02/02/2021    There are no preventive care reminders to display for this patient.  Lab Results  Component Value Date   TSH 1.16 01/19/2017   Lab Results  Component Value Date   WBC 5.7 01/19/2017   HGB 15.9 01/19/2017   HCT 46.8 01/19/2017   MCV 88.8 01/19/2017   PLT 315.0 01/19/2017   Lab Results  Component Value Date   NA 141 02/06/2018   K 4.4 02/06/2018   CO2 28 02/06/2018   GLUCOSE 105 (H) 02/06/2018   BUN 15 02/06/2018   CREATININE 0.87 02/06/2018   BILITOT 0.7 01/19/2017   ALKPHOS 43 01/19/2017   AST 16 01/19/2017   ALT 21 01/19/2017   PROT 7.2 01/19/2017   ALBUMIN 4.6 01/19/2017   CALCIUM 10.5 02/06/2018   ANIONGAP 9 05/24/2016   GFR 113.72 02/06/2018   Lab Results  Component Value Date   CHOL 278 (H) 09/18/2018   Lab Results  Component Value Date   HDL 43.70 09/18/2018   Lab Results  Component Value Date   LDLCALC 130 (H) 01/19/2017   Lab Results  Component Value Date   TRIG 280.0 (H) 09/18/2018   Lab Results  Component Value Date   CHOLHDL 6 09/18/2018   Lab Results  Component Value Date   HGBA1C 5.3 09/18/2018      Assessment & Plan:   #1 ADD stable on Adderall 10 mg twice daily -Continue current dose of Adderall  #2 history of fairly severe hyperlipidemia.  Positive family history of premature CAD.  We talked about lipids and consideration for statin therapy even though he is fairly young but with positive family history would consider if lipids not improved compared with last year.  He is fasting today and will repeat lipid panel  #3 history of  migraine headaches-overall stable -Refill sumatriptan for as needed use   Meds ordered this encounter  Medications   SUMAtriptan (IMITREX) 100 MG tablet    Sig: May repeat in 2 hours if headache persists or recurs.    Dispense:  9 tablet    Refill:  11    Follow-up: No follow-ups on file.    Smitty Cords  Elease Hashimoto, MD

## 2021-02-17 LAB — LIPID PANEL
Cholesterol: 215 mg/dL — ABNORMAL HIGH (ref 0–200)
HDL: 45.6 mg/dL (ref >39.00)
LDL Cholesterol: 145 mg/dL — ABNORMAL HIGH (ref 0–99)
NonHDL: 169.01
Total CHOL/HDL Ratio: 5
Triglycerides: 118 mg/dL (ref 0.0–149.0)
VLDL: 23.6 mg/dL (ref 0.0–40.0)

## 2021-02-17 LAB — BASIC METABOLIC PANEL
BUN: 11 mg/dL (ref 6–23)
CO2: 29 mEq/L (ref 19–32)
Calcium: 10 mg/dL (ref 8.4–10.5)
Chloride: 99 mEq/L (ref 96–112)
Creatinine, Ser: 0.92 mg/dL (ref 0.40–1.50)
GFR: 113.66 mL/min (ref >60.00)
Glucose, Bld: 73 mg/dL (ref 70–99)
Potassium: 4.1 mEq/L (ref 3.5–5.1)
Sodium: 137 mEq/L (ref 135–145)

## 2021-02-17 LAB — HEPATIC FUNCTION PANEL
ALT: 26 U/L (ref 0–53)
AST: 20 U/L (ref 0–37)
Albumin: 4.7 g/dL (ref 3.5–5.2)
Alkaline Phosphatase: 44 U/L (ref 39–117)
Bilirubin, Direct: 0.1 mg/dL (ref 0.0–0.3)
Total Bilirubin: 0.7 mg/dL (ref 0.2–1.2)
Total Protein: 7.8 g/dL (ref 6.0–8.3)

## 2021-03-09 ENCOUNTER — Other Ambulatory Visit: Payer: Self-pay | Admitting: Family Medicine

## 2021-03-10 MED ORDER — AMPHETAMINE-DEXTROAMPHETAMINE 10 MG PO TABS: 10.0000 mg | ORAL_TABLET | Freq: Two times a day (BID) | ORAL | 0 refills | Status: DC

## 2021-03-10 MED ORDER — AMPHETAMINE-DEXTROAMPHETAMINE 10 MG PO TABS: ORAL_TABLET | ORAL | 0 refills | Status: DC

## 2021-06-11 ENCOUNTER — Other Ambulatory Visit: Payer: Self-pay | Admitting: Family Medicine

## 2021-06-12 NOTE — Telephone Encounter (Signed)
Last filled 03/10/2021 Last OV 02/16/2021  Ok to fill?

## 2021-06-14 MED ORDER — AMPHETAMINE-DEXTROAMPHETAMINE 10 MG PO TABS: ORAL_TABLET | ORAL | 0 refills | Status: DC

## 2021-06-14 MED ORDER — AMPHETAMINE-DEXTROAMPHETAMINE 10 MG PO TABS: 10.0000 mg | ORAL_TABLET | Freq: Two times a day (BID) | ORAL | 0 refills | Status: DC

## 2021-07-21 ENCOUNTER — Encounter: Payer: Self-pay | Admitting: Family Medicine

## 2021-07-21 MED ORDER — LISDEXAMFETAMINE DIMESYLATE 30 MG PO CAPS: 30.0000 mg | ORAL_CAPSULE | Freq: Every day | ORAL | 0 refills | Status: DC

## 2021-07-23 ENCOUNTER — Telehealth: Payer: Self-pay

## 2021-07-23 NOTE — Telephone Encounter (Addendum)
Prior authorization completed for Vyvanse.  [employer] is reviewing your PA request. Typically an electronic response will be received within 24-72 hours. To check for an update later, open this request from your dashboard.

## 2021-07-24 NOTE — Telephone Encounter (Signed)
Request Reference Number: [id number]. VYVANSE CAP 30MG  is approved through 07/23/2022. Your patient may now fill this prescription and it will be covered.   Will contact patient via previous my chart message with approval.

## 2021-07-29 NOTE — Telephone Encounter (Signed)
Called [phone] spoke with Christian Lucas.    Non Formulary Exception review case was created, PA case number PA-A [id number].   Once decision is made will be faxed to office.

## 2021-08-04 NOTE — Telephone Encounter (Signed)
Ria Comment patient is aware that decision will be faxed to office.

## 2021-08-04 NOTE — Telephone Encounter (Signed)
Noted  

## 2021-08-10 NOTE — Telephone Encounter (Signed)
Prior authorization for Vyvanse 30mg  was denied, due to not trying and failing  two medications, Methylphenidate, etc.   [employer] [facility] is sending Another faxing explaining which medications that the patient will had to tried and failed before Vyvanse could be approved.       If patient have tried and failed  medications listed on fax, another prior authorization can be completed on Cover My Meds.

## 2021-08-11 NOTE — Telephone Encounter (Signed)
Spoke with the patient. She stated that he called [employer] yesterday and they actually approved the appeal we sent to them. He was able to get the vyvanse through insurance. Nothing further needed at this time.   Will send to Dr. Caryl Never as Lorain Childes

## 2021-08-11 NOTE — Telephone Encounter (Signed)
noted 

## 2021-09-03 ENCOUNTER — Other Ambulatory Visit: Payer: Self-pay | Admitting: Family Medicine

## 2021-09-03 NOTE — Telephone Encounter (Signed)
Last refill per controlled substance database: 08/10/21 ?Last OV: 02/16/21 ?Next OV: none scheduled. ?

## 2021-09-04 MED ORDER — LISDEXAMFETAMINE DIMESYLATE 30 MG PO CAPS: 30.0000 mg | ORAL_CAPSULE | Freq: Every day | ORAL | 0 refills | Status: DC

## 2021-09-06 ENCOUNTER — Other Ambulatory Visit: Payer: Self-pay | Admitting: Family Medicine

## 2021-09-07 MED ORDER — LISDEXAMFETAMINE DIMESYLATE 30 MG PO CAPS: 30.0000 mg | ORAL_CAPSULE | Freq: Every day | ORAL | 0 refills | Status: DC

## 2021-09-07 NOTE — Telephone Encounter (Signed)
Prescription resent

## 2021-11-05 ENCOUNTER — Other Ambulatory Visit: Payer: Self-pay | Admitting: Family Medicine

## 2021-11-05 NOTE — Telephone Encounter (Signed)
Last OV- 02/16/2021 ?Last refill-09/07/2021-30 capsules, 0 refills ? ?No future OV scheduled.   Can this patient receive a refill? ?

## 2021-11-06 MED ORDER — LISDEXAMFETAMINE DIMESYLATE 30 MG PO CAPS: 30.0000 mg | ORAL_CAPSULE | Freq: Every day | ORAL | 0 refills | Status: DC

## 2021-12-03 ENCOUNTER — Other Ambulatory Visit: Payer: Self-pay | Admitting: Family Medicine

## 2021-12-03 MED ORDER — LISDEXAMFETAMINE DIMESYLATE 30 MG PO CAPS
30.0000 mg | ORAL_CAPSULE | Freq: Every day | ORAL | 0 refills | Status: DC
Start: 2021-12-03 — End: 2022-01-01

## 2021-12-03 NOTE — Telephone Encounter (Signed)
Last OV- 02/16/21 Last refill- 11/06/21-30 capsules, 0 refill  No future OV scheduled.    Can this patient receive a refill?

## 2022-01-01 ENCOUNTER — Other Ambulatory Visit: Payer: Self-pay | Admitting: Family Medicine

## 2022-01-02 MED ORDER — LISDEXAMFETAMINE DIMESYLATE 30 MG PO CAPS
30.0000 mg | ORAL_CAPSULE | Freq: Every day | ORAL | 0 refills | Status: DC
Start: 2022-01-02 — End: 2022-01-31

## 2022-01-02 NOTE — Telephone Encounter (Signed)
Vyvanse refilled.  Will need office follow up by August.

## 2022-01-29 ENCOUNTER — Other Ambulatory Visit: Payer: Self-pay | Admitting: Family Medicine

## 2022-01-31 MED ORDER — LISDEXAMFETAMINE DIMESYLATE 40 MG PO CAPS: 40.0000 mg | ORAL_CAPSULE | ORAL | 0 refills | Status: DC

## 2022-01-31 NOTE — Telephone Encounter (Signed)
Sent in higher dose of Vyvanse 40 mg to [facility]

## 2022-02-05 ENCOUNTER — Other Ambulatory Visit: Payer: Self-pay | Admitting: Family Medicine

## 2022-02-08 MED ORDER — SUMATRIPTAN SUCCINATE 100 MG PO TABS: ORAL_TABLET | ORAL | 0 refills | Status: DC

## 2022-02-28 ENCOUNTER — Other Ambulatory Visit: Payer: Self-pay | Admitting: Family Medicine

## 2022-03-02 MED ORDER — LISDEXAMFETAMINE DIMESYLATE 40 MG PO CAPS
40.0000 mg | ORAL_CAPSULE | ORAL | 0 refills | Status: DC
Start: 2022-03-02 — End: 2022-04-02

## 2022-04-02 ENCOUNTER — Other Ambulatory Visit: Payer: Self-pay | Admitting: Family Medicine

## 2022-04-02 NOTE — Telephone Encounter (Signed)
Last OV-02-16-21 Last refill-03-02-22--30 capsules, 0 refills  No future OV scheduled.

## 2022-04-04 MED ORDER — LISDEXAMFETAMINE DIMESYLATE 40 MG PO CAPS
40.0000 mg | ORAL_CAPSULE | ORAL | 0 refills | Status: DC
Start: 2022-04-04 — End: 2022-04-30

## 2022-04-22 ENCOUNTER — Other Ambulatory Visit: Payer: Self-pay | Admitting: Family Medicine

## 2022-04-29 ENCOUNTER — Encounter: Payer: Self-pay | Admitting: Family Medicine

## 2022-04-30 ENCOUNTER — Other Ambulatory Visit: Payer: Self-pay | Admitting: Family Medicine

## 2022-04-30 MED ORDER — SUMATRIPTAN SUCCINATE 100 MG PO TABS: ORAL_TABLET | ORAL | 0 refills | Status: DC

## 2022-04-30 MED ORDER — LISDEXAMFETAMINE DIMESYLATE 40 MG PO CAPS: 40.0000 mg | ORAL_CAPSULE | ORAL | 0 refills | Status: DC

## 2022-04-30 NOTE — Telephone Encounter (Signed)
Last refill Vyvanse-04/04/22--30 tabs. 0 refills Last OV-02/16/21  Next OV-05/21/22

## 2022-05-21 ENCOUNTER — Ambulatory Visit (INDEPENDENT_AMBULATORY_CARE_PROVIDER_SITE_OTHER): Payer: No Typology Code available for payment source | Admitting: Family Medicine

## 2022-05-21 ENCOUNTER — Encounter: Payer: Self-pay | Admitting: Family Medicine

## 2022-05-21 VITALS — BP 126/80 | HR 110 | Temp 98.9°F | Ht 65.0 in | Wt 198.5 lb

## 2022-05-21 DIAGNOSIS — Z0000 Encounter for general adult medical examination without abnormal findings: Secondary | ICD-10-CM

## 2022-05-21 MED ORDER — LISDEXAMFETAMINE DIMESYLATE 40 MG PO CAPS: 40.0000 mg | ORAL_CAPSULE | ORAL | 0 refills | Status: DC

## 2022-05-21 MED ORDER — SUMATRIPTAN SUCCINATE 100 MG PO TABS: ORAL_TABLET | ORAL | 3 refills | Status: DC

## 2022-05-21 NOTE — Progress Notes (Signed)
   Established Patient Office Visit  Subjective   Patient ID: Christian Lucas, male    DOB: January 16, 1991  Age: 31 y.o. MRN: [id number]  Chief Complaint  Patient presents with   Annual Exam    HPI   Christian Lucas seen for physical exam.  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  He is currently [place] in [place].  Working very busy weeks with usually about 60 hours/week.  He has history of ADD and migraine headaches.  Also history of hyperlipidemia.  Has not found much time recently for exercise.  Fairly infrequent migraines.  Uses Imitrex if not relieved with over-the-counter medications.  He remains on Vyvanse 40 mg daily and requesting refill.  Health maintenance reviewed  -Tetanus up-to-date -Declines hepatitis C screening.  Low risk.  Family history-significant for father having coronary disease around age 5.  Father also has history of hyperlipidemia.  He has no siblings.  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  Non-smoker.  No regular alcohol.  He is currently single ROS    Objective:     BP 126/80 (BP Location: Left Arm, Patient Position: Sitting, Cuff Size: Normal)   Pulse (!) 110   Temp 98.9 F (37.2 C) (Oral)   Ht 5\' 5"  (1.651 m)   Wt 198 lb 8 oz (90 kg)   SpO2 97%   BMI 33.03 kg/m    Physical Exam   No results found for any visits on 05/21/22.    The ASCVD Risk score (Arnett DK, et al., 2019) failed to calculate for the following reasons:   The 2019 ASCVD risk score is only valid for ages 46 to 43    Assessment & Plan:   Problem List Items Addressed This Visit   None   No follow-ups on file.    76, MD

## 2022-07-02 ENCOUNTER — Other Ambulatory Visit: Payer: Self-pay | Admitting: Family Medicine

## 2022-07-02 MED ORDER — LISDEXAMFETAMINE DIMESYLATE 40 MG PO CAPS: 40.0000 mg | ORAL_CAPSULE | ORAL | 0 refills | Status: DC

## 2022-07-31 ENCOUNTER — Other Ambulatory Visit: Payer: Self-pay | Admitting: Family Medicine

## 2022-08-02 MED ORDER — LISDEXAMFETAMINE DIMESYLATE 40 MG PO CAPS: 40.0000 mg | ORAL_CAPSULE | ORAL | 0 refills | Status: DC

## 2022-08-30 ENCOUNTER — Other Ambulatory Visit: Payer: Self-pay | Admitting: Family Medicine

## 2022-08-31 MED ORDER — LISDEXAMFETAMINE DIMESYLATE 40 MG PO CAPS: 40.0000 mg | ORAL_CAPSULE | ORAL | 0 refills | Status: DC

## 2022-09-27 ENCOUNTER — Other Ambulatory Visit: Payer: Self-pay | Admitting: Family Medicine

## 2022-09-28 MED ORDER — LISDEXAMFETAMINE DIMESYLATE 40 MG PO CAPS: 40.0000 mg | ORAL_CAPSULE | ORAL | 0 refills | Status: DC

## 2022-10-26 ENCOUNTER — Other Ambulatory Visit: Payer: Self-pay | Admitting: Adult Health

## 2022-10-26 MED ORDER — LISDEXAMFETAMINE DIMESYLATE 40 MG PO CAPS: 40.0000 mg | ORAL_CAPSULE | ORAL | 0 refills | Status: DC

## 2022-11-01 ENCOUNTER — Other Ambulatory Visit: Payer: Self-pay | Admitting: Family Medicine

## 2022-11-01 NOTE — Telephone Encounter (Signed)
Prescription Request  11/01/2022  LOV: 05/21/2022  What is the name of the medication or equipment?     lisdexamfetamine (VYVANSE) 40 MG capsule  Have you contacted your pharmacy to request a refill? Yes   Which pharmacy would you like this sent to?  [facility] [address] [place] - [address] Phone: [phone]    Pt stated they don't have the 40 mg but want a new RX for the 50 mg only have one left for day. Patient notified that their request is being sent to the clinical staff for review and that they should receive a response within 2 business days.   Please advise at Mobile [phone] (mobile)

## 2022-11-02 ENCOUNTER — Telehealth: Payer: Self-pay | Admitting: Family Medicine

## 2022-11-02 NOTE — Telephone Encounter (Signed)
Pt mom is calling and lisdexamfetamine (VYVANSE) 40 MG capsule  is on back order however they have 50 mg in stock and mom would like new rx for 50 mg  [facility] [address] [place] - [address] Phone: [phone]  Fax: [phone]

## 2022-11-02 NOTE — Telephone Encounter (Signed)
Patient's mother informed of the message below and voiced understanding

## 2022-11-29 ENCOUNTER — Other Ambulatory Visit: Payer: Self-pay | Admitting: Family Medicine

## 2022-11-30 ENCOUNTER — Other Ambulatory Visit: Payer: Self-pay | Admitting: Family Medicine

## 2022-12-01 MED ORDER — LISDEXAMFETAMINE DIMESYLATE 40 MG PO CAPS: 40.0000 mg | ORAL_CAPSULE | ORAL | 0 refills | Status: DC

## 2022-12-27 ENCOUNTER — Other Ambulatory Visit: Payer: Self-pay | Admitting: Internal Medicine

## 2022-12-28 MED ORDER — LISDEXAMFETAMINE DIMESYLATE 40 MG PO CAPS: 40.0000 mg | ORAL_CAPSULE | ORAL | 0 refills | Status: DC

## 2023-01-27 ENCOUNTER — Other Ambulatory Visit: Payer: Self-pay | Admitting: Family Medicine

## 2023-01-27 MED ORDER — LISDEXAMFETAMINE DIMESYLATE 40 MG PO CAPS: 40.0000 mg | ORAL_CAPSULE | ORAL | 0 refills | Status: DC

## 2023-02-02 ENCOUNTER — Other Ambulatory Visit: Payer: Self-pay | Admitting: Family Medicine

## 2023-02-04 MED ORDER — LISDEXAMFETAMINE DIMESYLATE 40 MG PO CAPS: 40.0000 mg | ORAL_CAPSULE | ORAL | 0 refills | Status: DC

## 2023-03-04 ENCOUNTER — Other Ambulatory Visit: Payer: Self-pay | Admitting: Family Medicine

## 2023-03-06 MED ORDER — LISDEXAMFETAMINE DIMESYLATE 40 MG PO CAPS: 40.0000 mg | ORAL_CAPSULE | ORAL | 0 refills | Status: DC

## 2023-04-04 ENCOUNTER — Other Ambulatory Visit: Payer: Self-pay | Admitting: Family Medicine

## 2023-04-05 MED ORDER — LISDEXAMFETAMINE DIMESYLATE 40 MG PO CAPS: 40.0000 mg | ORAL_CAPSULE | ORAL | 0 refills | Status: DC

## 2023-04-09 ENCOUNTER — Other Ambulatory Visit: Payer: Self-pay | Admitting: Family Medicine

## 2023-05-03 ENCOUNTER — Other Ambulatory Visit: Payer: Self-pay | Admitting: Family Medicine

## 2023-05-03 MED ORDER — LISDEXAMFETAMINE DIMESYLATE 40 MG PO CAPS: 40.0000 mg | ORAL_CAPSULE | ORAL | 0 refills | Status: DC

## 2023-05-03 NOTE — Telephone Encounter (Signed)
Refilled once.   Needs office follow up within next month.

## 2023-06-03 ENCOUNTER — Other Ambulatory Visit: Payer: Self-pay | Admitting: Family Medicine

## 2023-06-06 MED ORDER — LISDEXAMFETAMINE DIMESYLATE 40 MG PO CAPS: 40.0000 mg | ORAL_CAPSULE | ORAL | 0 refills | Status: DC

## 2023-06-06 NOTE — Telephone Encounter (Signed)
Refilled once.  Needs office follow-up.  Eulas Post MD [facility]

## 2023-07-05 ENCOUNTER — Other Ambulatory Visit: Payer: Self-pay | Admitting: Family Medicine

## 2023-07-10 MED ORDER — LISDEXAMFETAMINE DIMESYLATE 40 MG PO CAPS: 40.0000 mg | ORAL_CAPSULE | ORAL | 0 refills | Status: DC

## 2023-07-10 NOTE — Telephone Encounter (Signed)
 Refilled Vyvanse once.  Make sure sets up office follow up.   Not seen > one year.  Kristian Covey MD [facility]

## 2023-07-29 ENCOUNTER — Other Ambulatory Visit: Payer: Self-pay | Admitting: Family Medicine

## 2023-09-12 ENCOUNTER — Encounter: Payer: Self-pay | Admitting: Family Medicine

## 2023-09-12 ENCOUNTER — Ambulatory Visit (INDEPENDENT_AMBULATORY_CARE_PROVIDER_SITE_OTHER): Payer: Self-pay | Admitting: Family Medicine

## 2023-09-12 VITALS — BP 112/72 | HR 77 | Temp 98.3°F | Wt 201.3 lb

## 2023-09-12 DIAGNOSIS — G43909 Migraine, unspecified, not intractable, without status migrainosus: Secondary | ICD-10-CM | POA: Diagnosis not present

## 2023-09-12 DIAGNOSIS — F988 Other specified behavioral and emotional disorders with onset usually occurring in childhood and adolescence: Secondary | ICD-10-CM | POA: Diagnosis not present

## 2023-09-12 MED ORDER — SUMATRIPTAN SUCCINATE 100 MG PO TABS: ORAL_TABLET | ORAL | 5 refills | Status: AC

## 2023-09-12 MED ORDER — LISDEXAMFETAMINE DIMESYLATE 40 MG PO CAPS: 40.0000 mg | ORAL_CAPSULE | ORAL | 0 refills | Status: DC

## 2023-09-12 NOTE — Progress Notes (Signed)
   Established Patient Office Visit  Subjective   Patient ID: Christian Lucas, male    DOB: [birth year]  Age: 33 y.o. MRN: [id number]  Chief Complaint  Patient presents with   Medication Refill    HPI   Christian Lucas is seen for medical follow-up.  He has ADD treated with Vyvanse 40 mg daily.  This dose seems to be working well for him.  He also has history of migraine headaches and takes Imitrex as needed and requesting refill.  Currently very stressful job as a Engineer, drilling.  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  Does have very stressful work conditions  He has set up a home gym and is trying to exercise fairly regularly.  History reviewed. No pertinent past medical history. Past Surgical History:  Procedure Laterality Date   TONSILLECTOMY     TYMPANOSTOMY TUBE PLACEMENT      reports that he has never smoked. He has never used smokeless tobacco. He reports current alcohol use. He reports that he does not use drugs. family history includes Cancer (age of onset: 29) in his maternal grandfather; Heart disease (age of onset: 48) in his father; Hyperlipidemia in his father; Hypertension in his father. No Known Allergies  Review of Systems  Constitutional:  Negative for malaise/fatigue.  Eyes:  Negative for blurred vision.  Respiratory:  Negative for shortness of breath.   Cardiovascular:  Negative for chest pain.  Neurological:  Negative for dizziness, weakness and headaches.      Objective:     BP 112/72 (BP Location: Left Arm, Patient Position: Sitting, Cuff Size: Normal)   Pulse 77   Temp 98.3 F (36.8 C) (Oral)   Wt 201 lb 4.8 oz (91.3 kg)   SpO2 98%   BMI 33.50 kg/m  BP Readings from Last 3 Encounters:  09/12/23 112/72  05/21/22 126/80  02/16/21 115/78   Wt Readings from Last 3 Encounters:  09/12/23 201 lb 4.8 oz (91.3 kg)  05/21/22 198 lb 8 oz (90 kg)  02/16/21 188 lb 1.6 oz (85.3 kg)      Physical Exam Vitals reviewed.   Constitutional:      General: He is not in acute distress.    Appearance: He is well-developed. He is not ill-appearing.  Eyes:     Pupils: Pupils are equal, round, and reactive to light.  Neck:     Thyroid: No thyromegaly.  Cardiovascular:     Rate and Rhythm: Normal rate and regular rhythm.  Pulmonary:     Effort: Pulmonary effort is normal. No respiratory distress.     Breath sounds: Normal breath sounds. No wheezing or rales.  Musculoskeletal:     Cervical back: Neck supple.  Neurological:     Mental Status: He is alert.      No results found for any visits on 09/12/23.    The ASCVD Risk score (Arnett DK, et al., 2019) failed to calculate for the following reasons:   The 2019 ASCVD risk score is only valid for ages 27 to 67    Assessment & Plan:   #1  ADD.  Stable.  Patient doing well on Vyvanse 40 mg daily.  Refill Vyvanse for 2 months  #2 history of migraine headaches without aura.  Usually responds well to Imitrex.  Refills provided.  Consider prophylactic medications if these are becoming more frequent. Evelena Peat, MD

## 2023-10-14 ENCOUNTER — Other Ambulatory Visit: Payer: Self-pay | Admitting: Family Medicine

## 2023-10-14 MED ORDER — LISDEXAMFETAMINE DIMESYLATE 40 MG PO CAPS: 40.0000 mg | ORAL_CAPSULE | ORAL | 0 refills | Status: DC

## 2023-11-13 ENCOUNTER — Other Ambulatory Visit: Payer: Self-pay | Admitting: Family Medicine

## 2024-01-13 ENCOUNTER — Encounter: Payer: Self-pay | Admitting: Family Medicine

## 2024-01-15 MED ORDER — LISDEXAMFETAMINE DIMESYLATE 40 MG PO CAPS: 40.0000 mg | ORAL_CAPSULE | ORAL | 0 refills | Status: DC

## 2024-03-16 MED ORDER — LISDEXAMFETAMINE DIMESYLATE 40 MG PO CAPS: 40.0000 mg | ORAL_CAPSULE | ORAL | 0 refills | Status: DC

## 2024-03-16 MED ORDER — LISDEXAMFETAMINE DIMESYLATE 40 MG PO CAPS: 40.0000 mg | ORAL_CAPSULE | ORAL | 0 refills | Status: AC

## 2024-03-16 NOTE — Addendum Note (Signed)
 Addended by: MICHEAL WOLM ORN on: 03/16/2024 01:06 PM   Modules accepted: Orders

## 2024-03-16 NOTE — Addendum Note (Signed)
 Addended by: METTA KRISTEN CROME on: 03/16/2024 07:28 AM   Modules accepted: Orders

## 2024-06-13 MED ORDER — LISDEXAMFETAMINE DIMESYLATE 40 MG PO CAPS: 40.0000 mg | ORAL_CAPSULE | ORAL | 0 refills | Status: AC

## 2024-06-13 NOTE — Addendum Note (Signed)
 Addended by: MICHEAL WOLM ORN on: 06/13/2024 11:59 AM   Modules accepted: Orders
